# Patient Record
Sex: Female | Born: 1965 | Race: Black or African American | Hispanic: No | Marital: Married | Smoking: Never smoker
Health system: Southern US, Community
[De-identification: ages and names within clinical notes are randomized; demographics above are authoritative.]

## PROBLEM LIST (undated history)

## (undated) DIAGNOSIS — D649 Anemia, unspecified: Secondary | ICD-10-CM

## (undated) DIAGNOSIS — Z8679 Personal history of other diseases of the circulatory system: Secondary | ICD-10-CM

## (undated) DIAGNOSIS — I341 Nonrheumatic mitral (valve) prolapse: Secondary | ICD-10-CM

## (undated) DIAGNOSIS — H33109 Unspecified retinoschisis, unspecified eye: Secondary | ICD-10-CM

## (undated) DIAGNOSIS — M349 Systemic sclerosis, unspecified: Secondary | ICD-10-CM

## (undated) DIAGNOSIS — E7800 Pure hypercholesterolemia, unspecified: Secondary | ICD-10-CM

## (undated) DIAGNOSIS — I10 Essential (primary) hypertension: Secondary | ICD-10-CM

## (undated) DIAGNOSIS — K449 Diaphragmatic hernia without obstruction or gangrene: Secondary | ICD-10-CM

## (undated) DIAGNOSIS — M62838 Other muscle spasm: Secondary | ICD-10-CM

## (undated) DIAGNOSIS — E038 Other specified hypothyroidism: Secondary | ICD-10-CM

## (undated) DIAGNOSIS — G4733 Obstructive sleep apnea (adult) (pediatric): Secondary | ICD-10-CM

## (undated) DIAGNOSIS — R011 Cardiac murmur, unspecified: Secondary | ICD-10-CM

## (undated) DIAGNOSIS — B019 Varicella without complication: Secondary | ICD-10-CM

## (undated) HISTORY — DX: Other specified hypothyroidism: E03.8

## (undated) HISTORY — DX: Other muscle spasm: M62.838

## (undated) HISTORY — DX: Cardiac murmur, unspecified: R01.1

## (undated) HISTORY — DX: Nonrheumatic mitral (valve) prolapse: I34.1

## (undated) HISTORY — PX: BREAST SURGERY: SHX581

## (undated) HISTORY — DX: Personal history of other diseases of the circulatory system: Z86.79

## (undated) HISTORY — DX: Varicella without complication: B01.9

## (undated) HISTORY — DX: Anemia, unspecified: D64.9

## (undated) HISTORY — DX: Systemic sclerosis, unspecified: M34.9

## (undated) HISTORY — DX: Obstructive sleep apnea (adult) (pediatric): G47.33

## (undated) HISTORY — DX: Essential (primary) hypertension: I10

## (undated) HISTORY — DX: Pure hypercholesterolemia, unspecified: E78.00

## (undated) HISTORY — DX: Unspecified retinoschisis, unspecified eye: H33.109

## (undated) HISTORY — DX: Diaphragmatic hernia without obstruction or gangrene: K44.9

---

## 1997-01-31 HISTORY — PX: HERNIA REPAIR: SHX51

## 1997-07-16 ENCOUNTER — Ambulatory Visit (HOSPITAL_COMMUNITY): Admission: RE | Admit: 1997-07-16 | Discharge: 1997-07-16 | Payer: Self-pay | Admitting: Obstetrics and Gynecology

## 1997-09-16 ENCOUNTER — Ambulatory Visit (HOSPITAL_COMMUNITY): Admission: RE | Admit: 1997-09-16 | Discharge: 1997-09-16 | Payer: Self-pay | Admitting: Obstetrics and Gynecology

## 1997-10-13 ENCOUNTER — Inpatient Hospital Stay (HOSPITAL_COMMUNITY): Admission: AD | Admit: 1997-10-13 | Discharge: 1997-10-13 | Payer: Self-pay | Admitting: Obstetrics and Gynecology

## 1997-10-16 ENCOUNTER — Ambulatory Visit (HOSPITAL_COMMUNITY): Admission: RE | Admit: 1997-10-16 | Discharge: 1997-10-16 | Payer: Self-pay | Admitting: Obstetrics and Gynecology

## 1997-11-02 ENCOUNTER — Inpatient Hospital Stay (HOSPITAL_COMMUNITY): Admission: AD | Admit: 1997-11-02 | Discharge: 1997-11-02 | Payer: Self-pay | Admitting: Obstetrics and Gynecology

## 1997-11-28 ENCOUNTER — Inpatient Hospital Stay (HOSPITAL_COMMUNITY): Admission: AD | Admit: 1997-11-28 | Discharge: 1997-11-28 | Payer: Self-pay | Admitting: Obstetrics and Gynecology

## 1997-11-29 ENCOUNTER — Inpatient Hospital Stay (HOSPITAL_COMMUNITY): Admission: AD | Admit: 1997-11-29 | Discharge: 1997-11-29 | Payer: Self-pay | Admitting: Obstetrics and Gynecology

## 1997-12-03 ENCOUNTER — Inpatient Hospital Stay (HOSPITAL_COMMUNITY): Admission: AD | Admit: 1997-12-03 | Discharge: 1997-12-05 | Payer: Self-pay | Admitting: Obstetrics and Gynecology

## 1997-12-05 ENCOUNTER — Encounter (HOSPITAL_COMMUNITY): Admission: RE | Admit: 1997-12-05 | Discharge: 1998-01-09 | Payer: Self-pay | Admitting: Obstetrics and Gynecology

## 1998-01-19 ENCOUNTER — Ambulatory Visit (HOSPITAL_BASED_OUTPATIENT_CLINIC_OR_DEPARTMENT_OTHER): Admission: RE | Admit: 1998-01-19 | Discharge: 1998-01-19 | Payer: Self-pay | Admitting: General Surgery

## 1999-03-17 ENCOUNTER — Encounter: Payer: Self-pay | Admitting: Family Medicine

## 1999-03-17 ENCOUNTER — Ambulatory Visit (HOSPITAL_COMMUNITY): Admission: RE | Admit: 1999-03-17 | Discharge: 1999-03-17 | Payer: Self-pay | Admitting: Family Medicine

## 2000-03-17 ENCOUNTER — Ambulatory Visit (HOSPITAL_COMMUNITY): Admission: RE | Admit: 2000-03-17 | Discharge: 2000-03-17 | Payer: Self-pay | Admitting: Family Medicine

## 2000-03-17 ENCOUNTER — Encounter: Payer: Self-pay | Admitting: Family Medicine

## 2000-03-20 ENCOUNTER — Ambulatory Visit (HOSPITAL_COMMUNITY): Admission: RE | Admit: 2000-03-20 | Discharge: 2000-03-20 | Payer: Self-pay | Admitting: Neurology

## 2000-03-20 ENCOUNTER — Encounter: Payer: Self-pay | Admitting: Neurology

## 2000-04-10 ENCOUNTER — Emergency Department (HOSPITAL_COMMUNITY): Admission: EM | Admit: 2000-04-10 | Discharge: 2000-04-10 | Payer: Self-pay | Admitting: Emergency Medicine

## 2000-05-24 ENCOUNTER — Ambulatory Visit (HOSPITAL_COMMUNITY): Admission: RE | Admit: 2000-05-24 | Discharge: 2000-05-24 | Payer: Self-pay | Admitting: Family Medicine

## 2000-05-24 ENCOUNTER — Encounter: Payer: Self-pay | Admitting: Family Medicine

## 2000-12-29 ENCOUNTER — Encounter: Payer: Self-pay | Admitting: Family Medicine

## 2000-12-29 ENCOUNTER — Encounter: Admission: RE | Admit: 2000-12-29 | Discharge: 2000-12-29 | Payer: Self-pay | Admitting: Family Medicine

## 2001-01-10 ENCOUNTER — Encounter: Admission: RE | Admit: 2001-01-10 | Discharge: 2001-01-10 | Payer: Self-pay | Admitting: Family Medicine

## 2001-01-10 ENCOUNTER — Encounter: Payer: Self-pay | Admitting: Family Medicine

## 2001-06-28 ENCOUNTER — Other Ambulatory Visit: Admission: RE | Admit: 2001-06-28 | Discharge: 2001-06-28 | Payer: Self-pay | Admitting: Obstetrics and Gynecology

## 2001-12-17 ENCOUNTER — Ambulatory Visit (HOSPITAL_COMMUNITY): Admission: RE | Admit: 2001-12-17 | Discharge: 2001-12-17 | Payer: Self-pay | Admitting: *Deleted

## 2001-12-17 ENCOUNTER — Encounter: Payer: Self-pay | Admitting: *Deleted

## 2002-10-30 ENCOUNTER — Ambulatory Visit (HOSPITAL_COMMUNITY): Admission: RE | Admit: 2002-10-30 | Discharge: 2002-10-30 | Payer: Self-pay | Admitting: Obstetrics and Gynecology

## 2002-10-30 ENCOUNTER — Encounter: Payer: Self-pay | Admitting: Obstetrics and Gynecology

## 2005-07-05 ENCOUNTER — Ambulatory Visit (HOSPITAL_COMMUNITY): Admission: RE | Admit: 2005-07-05 | Discharge: 2005-07-05 | Payer: Self-pay | Admitting: Obstetrics and Gynecology

## 2007-02-01 HISTORY — PX: SPINE SURGERY: SHX786

## 2007-08-27 ENCOUNTER — Emergency Department (HOSPITAL_COMMUNITY): Admission: EM | Admit: 2007-08-27 | Discharge: 2007-08-27 | Payer: Self-pay | Admitting: Emergency Medicine

## 2007-09-02 ENCOUNTER — Encounter: Payer: Self-pay | Admitting: Family Medicine

## 2007-09-02 ENCOUNTER — Encounter: Admission: RE | Admit: 2007-09-02 | Discharge: 2007-09-02 | Payer: Self-pay | Admitting: Family Medicine

## 2007-09-17 ENCOUNTER — Encounter: Admission: RE | Admit: 2007-09-17 | Discharge: 2007-10-05 | Payer: Self-pay | Admitting: Family Medicine

## 2007-11-22 ENCOUNTER — Encounter: Admission: RE | Admit: 2007-11-22 | Discharge: 2007-11-22 | Payer: Self-pay | Admitting: Neurological Surgery

## 2008-01-10 ENCOUNTER — Inpatient Hospital Stay (HOSPITAL_COMMUNITY): Admission: RE | Admit: 2008-01-10 | Discharge: 2008-01-14 | Payer: Self-pay | Admitting: Neurological Surgery

## 2008-02-12 ENCOUNTER — Encounter: Admission: RE | Admit: 2008-02-12 | Discharge: 2008-02-12 | Payer: Self-pay | Admitting: Neurological Surgery

## 2008-04-14 ENCOUNTER — Encounter: Admission: RE | Admit: 2008-04-14 | Discharge: 2008-04-14 | Payer: Self-pay | Admitting: Neurological Surgery

## 2008-08-18 ENCOUNTER — Encounter: Admission: RE | Admit: 2008-08-18 | Discharge: 2008-08-18 | Payer: Self-pay | Admitting: Neurological Surgery

## 2008-09-01 ENCOUNTER — Encounter: Admission: RE | Admit: 2008-09-01 | Discharge: 2008-09-01 | Payer: Self-pay | Admitting: Dermatology

## 2010-06-15 NOTE — Op Note (Signed)
NAME:  Alicia Huff, Alicia Huff NO.:  [id number]   MEDICAL RECORD NO.:  [id number]          PATIENT TYPE:  INP   LOCATION:  3023                         FACILITY:  [facility]   PHYSICIAN:  Tia Alert, MD     DATE OF BIRTH:  08-08-1965   DATE OF PROCEDURE:  01/10/2008  DATE OF DISCHARGE:                               OPERATIVE REPORT   PREOPERATIVE DIAGNOSES:  1. Degenerative disk disease at L4-5.  2. Herniated nucleus pulposus at L4-5.  3. Back pain.   POSTOPERATIVE DIAGNOSES:  1. Degenerative disk disease at L4-5.  2. Herniated nucleus pulposus at L4-5.  3. Back pain.   PROCEDURE:  Anterior retroperitoneal lumbar interbody fusion at L4-5  with lateral plating with an XLT plate.   SURGEON:  Tia Alert, MD   ASSISTANT:  Reinaldo Meeker, MD   ANESTHESIA:  General endotracheal.   COMPLICATIONS:  None apparent.   INDICATIONS FOR PROCEDURE:  Ms. Scannell is a very pleasant 45-  year-old female who was referred with severe back pain with some aching  in her leg.  She had an MRI and then CT myelogram which showed large  annular tear at L4-5 with a midline disk herniation with disk  desiccation at that level.  She had mostly diskogenic back pain.  Therefore, I recommended a lumbar interbody fusion through anterior  retroperitoneal approach.  She understood the risks, benefits, and  expected outcome and wished to proceed.   DESCRIPTION OF PROCEDURE:  The patient was taken to the operating room  and after induction of adequate generalized endotracheal anesthesia, she  was placed in the lateral position with a right side down and left side  exposed.  She was positioned for the typical anterior retroperitoneal  approach.  We marked our incisions utilizing lateral fluoroscopy and  then made a lateral incision and a more posterior incision.  We then  used blunt dissection with our fingers to dissect into the  retroperitoneal space through the more posterior  incision until we could  palpate the transverse processes and the psoas musculature and the iliac  crest, then dissected from the posterior incision to the anterior  incision utilizing her fingers and then guided our first dilator to the  psoas musculature.  This was attached to EMG monitoring.  We docked in  the mid portion of the disk space and then used sequential dilation  utilizing the EMG monitoring the entire time on each dilator turning it  anterior and posterior to make sure that we were not close to the nerves  within the psoas musculature.  Once we got a final retractor in place,  we expanded it opened two clicks both anterior-posterior and cranial  caudal and then removed the dilators leaving the K-wire in place and  then tested with the nerve monitoring to ensure there were no nerves  within our field.  We then placed the shim into the disk space at L4-5  utilized going back and forth between AP and lateral fluoroscopy of the  entire case.  The annulus was then incised.  The diskectomy was dome  with a pituitary rongeurs and then, we passed the  Cobb dissector along  the endplates and through the opposite lateral annulus.  We made sure we  had an anterior posterior trajectory slightly because of the vessels on  the opposite side.  We opened the opposite annulus and then we used  sequential distraction to distract the disk space of 10 mm.  We used our  first template as a 10 x 50 mm template.  We then continued to clean the  disk space with different rasps and scrapers until the endplates were  prepared.  We then used a 10 x 50 mm PEEK interbody cage and packed this  with Actifuse and Osteocel Plus, and then tapped this into position at  L4-5 again obtaining that anterior to posterior trajectory just slightly  until it was in position from apophysis to apophysis at L4-5 and we did  this under AP fluoroscopy.  We then used a lateral XLT plate.  We used  the guide with the  template to hand drill both at L4 and L5 and then  placed 45 mm bolts into the vertebral bodies of L4 and L5.  We then  placed the lateral plate and locked this into position with a locking  cap through the anti-torque device.  We then got a final AP and lateral  fluoroscopic shots to evaluate our construct, irrigated with saline  solution containing bacitracin, removed our retractor, dried all  bleeding points, and then closed the fascia with 2-0 Vicryl, closed the  subcuticular tissue with 3-0 Vicryl, and closed the skin with Benzoin  and Steri-Strips.  The drapes were removed.  Sterile dressing was  applied.  The patient was awakened from general anesthesia and  transferred to recovery room in stable condition.  At the end of the  procedure, all sponge, needle, and instrument counts were correct.      Tia Alert, MD  Electronically Signed     DSJ/MEDQ  D:  01/10/2008  T:  01/11/2008  Job:  [id number]

## 2010-06-15 NOTE — Discharge Summary (Signed)
NAME:  Alicia Huff, Alicia Huff    ACCOUNT NO.:  [id number]   MEDICAL RECORD NO.:  [id number]          PATIENT TYPE:  INP   LOCATION:  3023                         FACILITY:  [facility]   PHYSICIAN:  Tia Alert, MD     DATE OF BIRTH:  1965-08-22   DATE OF ADMISSION:  01/10/2008  DATE OF DISCHARGE:  01/14/2008                               DISCHARGE SUMMARY   ADMITTING DIAGNOSES:  Lumbar degenerative disk disease with annular tear  and disk rupture at L4-5.   PROCEDURE:  Anterior retroperitoneal interbody fusion at L4-5.   BRIEF HISTORY OF PRESENT ILLNESS:  [name] is a 45 year old female who  presented with severe back pain with some leg pain.  She had an MRI and  a CT myelogram, which showed degenerative disk disease at L4-5 with a  large annular tear and a midline disk herniations.  She tried medical  management for quite some time without significant relief.  I  recommended anterior retroperitoneal approach to the interbody fusion.  She understood the risks, benefits, expected outcome, and wished to  proceed.   HOSPITAL COURSE:  The patient was admitted on January 10, 2008, and  taken to the operating room where she underwent an anterior  retroperitoneal interbody fusion L4-5 with lateral plating.  She  tolerated the procedure well and was taken to the recovery room in full  stable condition.  For details of the operative procedure, please see  the dictated operative note.  Her hospital course was fairly routine.  She did have some pain and numbness in the anterior quadriceps from the  psoas muscle dissection from the surgery.  Her back pain was improved,  as compared to preop.  She worked with physical and occupational therapy  and made some stride.  She remained afebrile with stable vital signs.  Her incisions remained clean, dry, and intact.  She was tolerating a  regular diet.  She was discharged home in stable condition on January 14, 2008, with plans to follow up in 2  weeks.   FINAL DIAGNOSIS:  Anterior retroperitoneal interbody fusion.      Tia Alert, MD  Electronically Signed     DSJ/MEDQ  D:  01/14/2008  T:  01/14/2008  Job:  [id number]

## 2010-09-28 IMAGING — CR DG CHEST 2V
2 series · 2 of 2 positions shown · non-contrast
Comparison: None

CLINICAL DATA: Preadmission for OR.

CHEST - 2 VIEW

[view not recorded (1 of 2)]
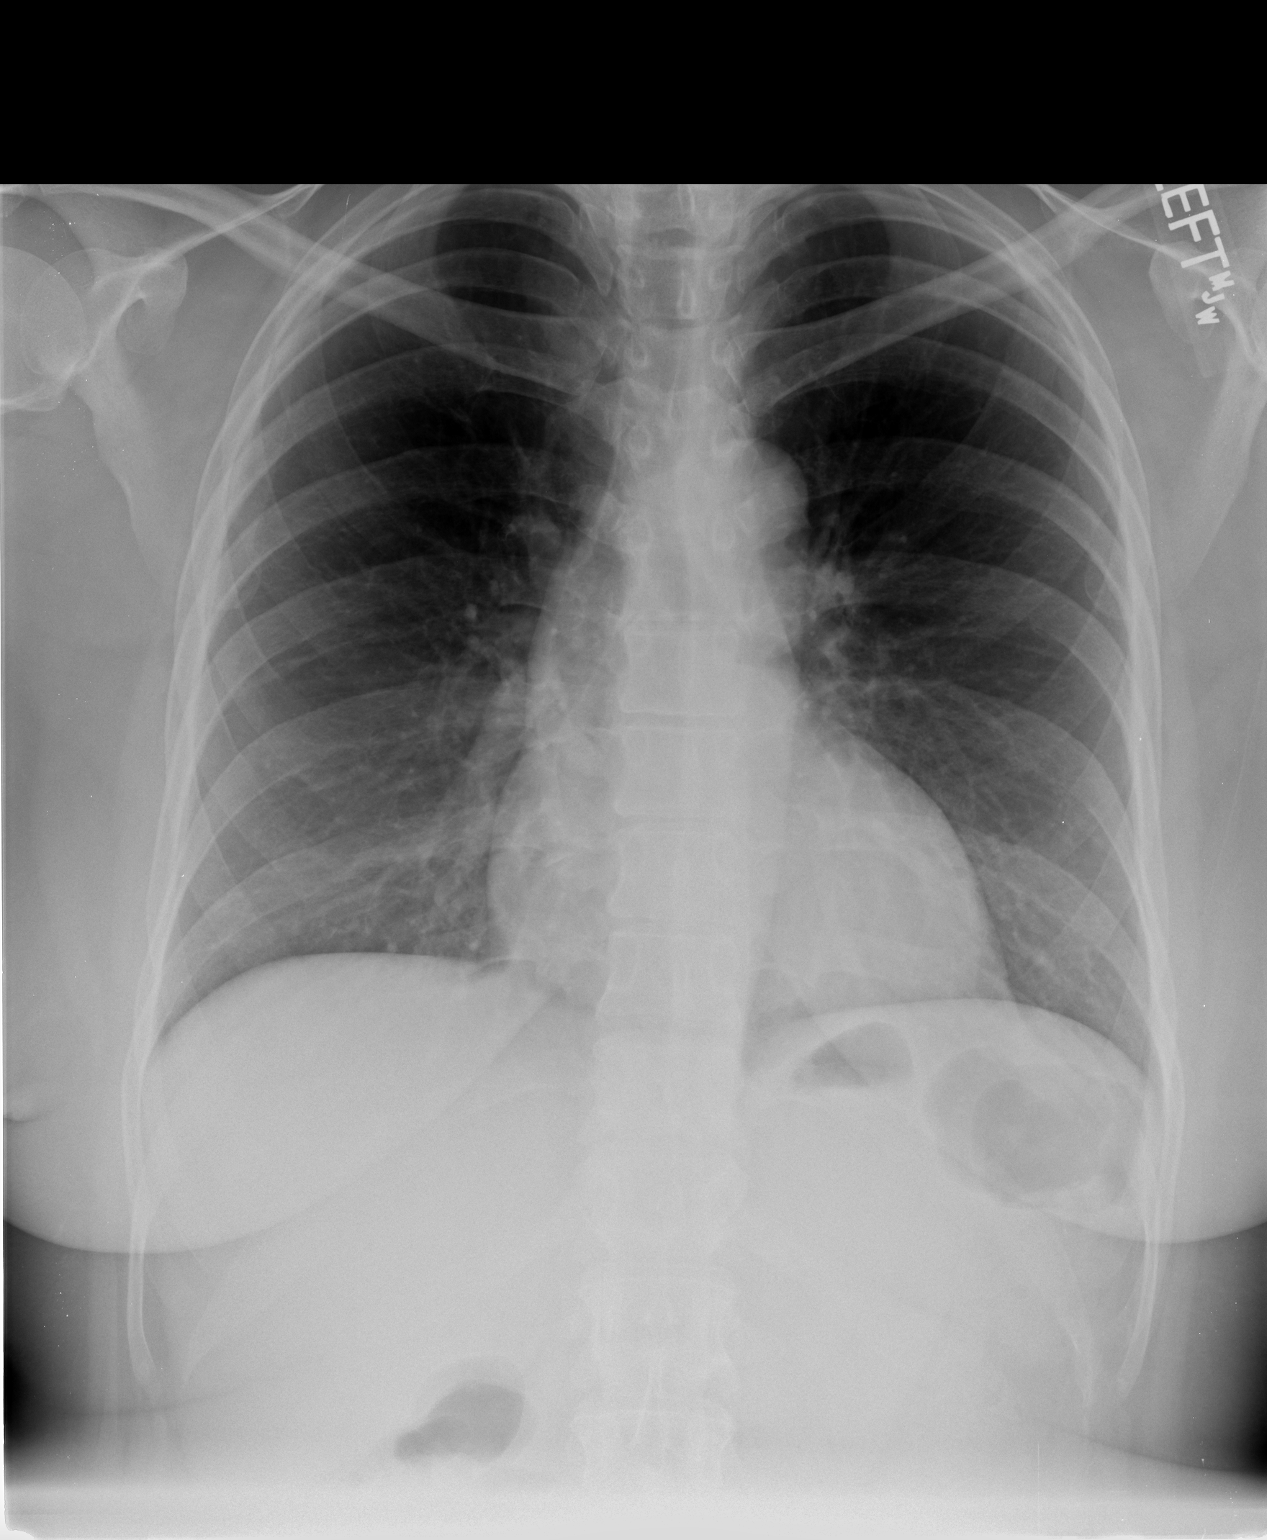

[view not recorded (2 of 2)]
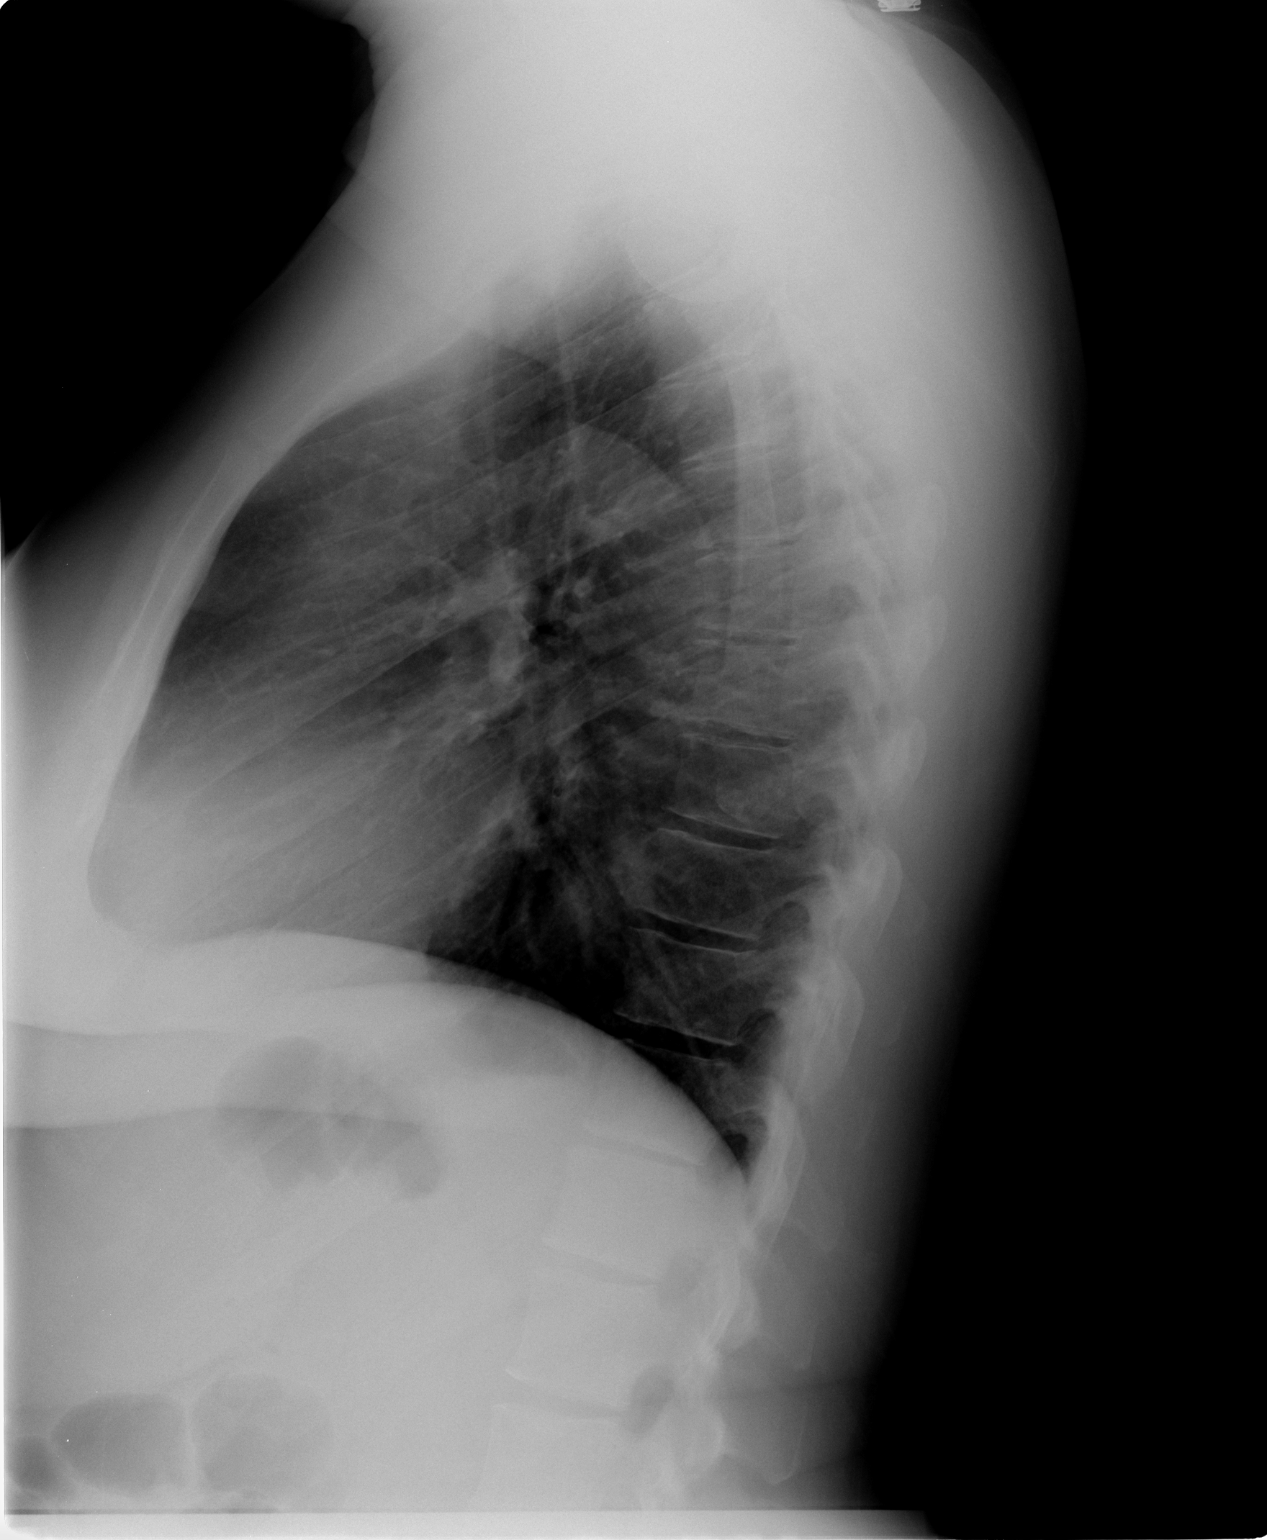

[2 of 2 positions shown; findings below may reference images not displayed]

FINDINGS: Trachea is midline.  Heart size within normal limits.
Lungs are clear.  No pleural fluid.
IMPRESSION: No acute findings.

## 2010-11-04 LAB — TYPE AND SCREEN
ABO/RH(D): B POS
Antibody Screen: POSITIVE
Antibody Screen: POSITIVE

## 2010-11-04 LAB — PROTIME-INR: INR: 1 (ref 0.00–1.49)

## 2010-11-04 LAB — BASIC METABOLIC PANEL
Calcium: 9.6 mg/dL (ref 8.4–10.5)
GFR calc Af Amer: >60 mL/min (ref >60)
GFR calc non Af Amer: >60 mL/min (ref >60)
Sodium: 136 mEq/L (ref 135–145)

## 2010-11-04 LAB — CBC
Hemoglobin: 9.9 g/dL — ABNORMAL LOW (ref 12.0–15.0)
RBC: 5.34 MIL/uL — ABNORMAL HIGH (ref 3.87–5.11)

## 2010-11-04 LAB — APTT: aPTT: 30 seconds (ref 24–37)

## 2010-11-04 LAB — DIFFERENTIAL
Basophils Relative: 0 % (ref 0–1)
Eosinophils Relative: 1 % (ref 0–5)
Lymphocytes Relative: 24 % (ref 12–46)
Neutrophils Relative %: 64 % (ref 43–77)

## 2011-05-08 IMAGING — CR DG LUMBAR SPINE 2-3V
3 series · 3 of 3 positions shown · non-contrast
Comparison: 04/14/2008

CLINICAL DATA: Low back pain/surgery January 2008

LUMBAR SPINE - 2-3 VIEW

[t l-spine a.p.]
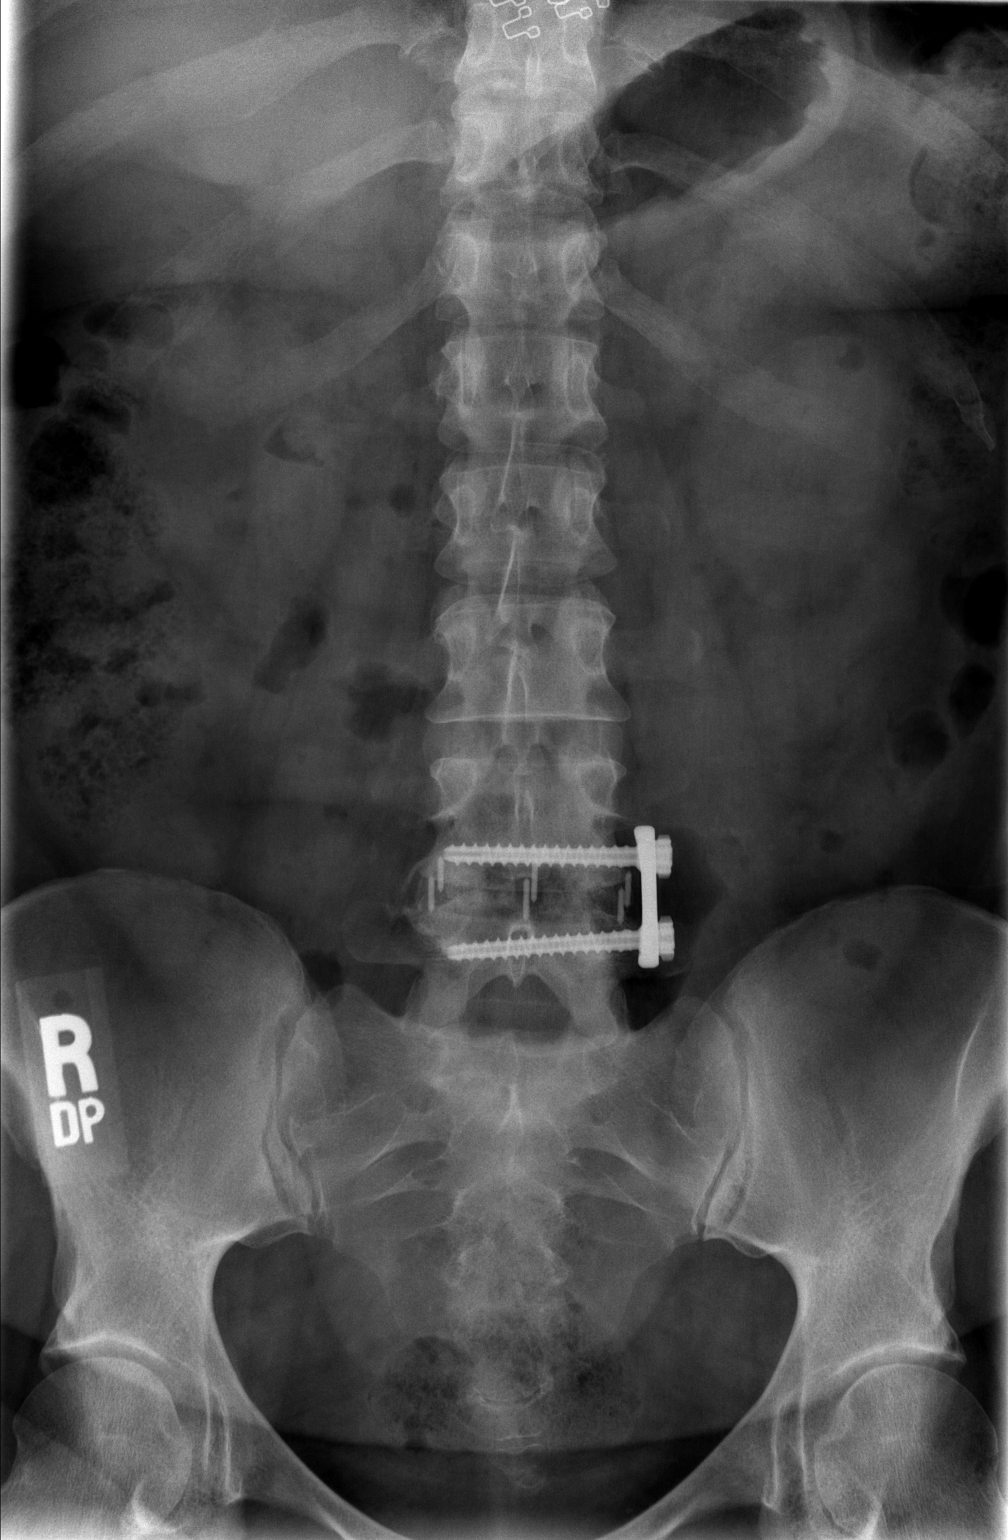

[t l-spine lat]
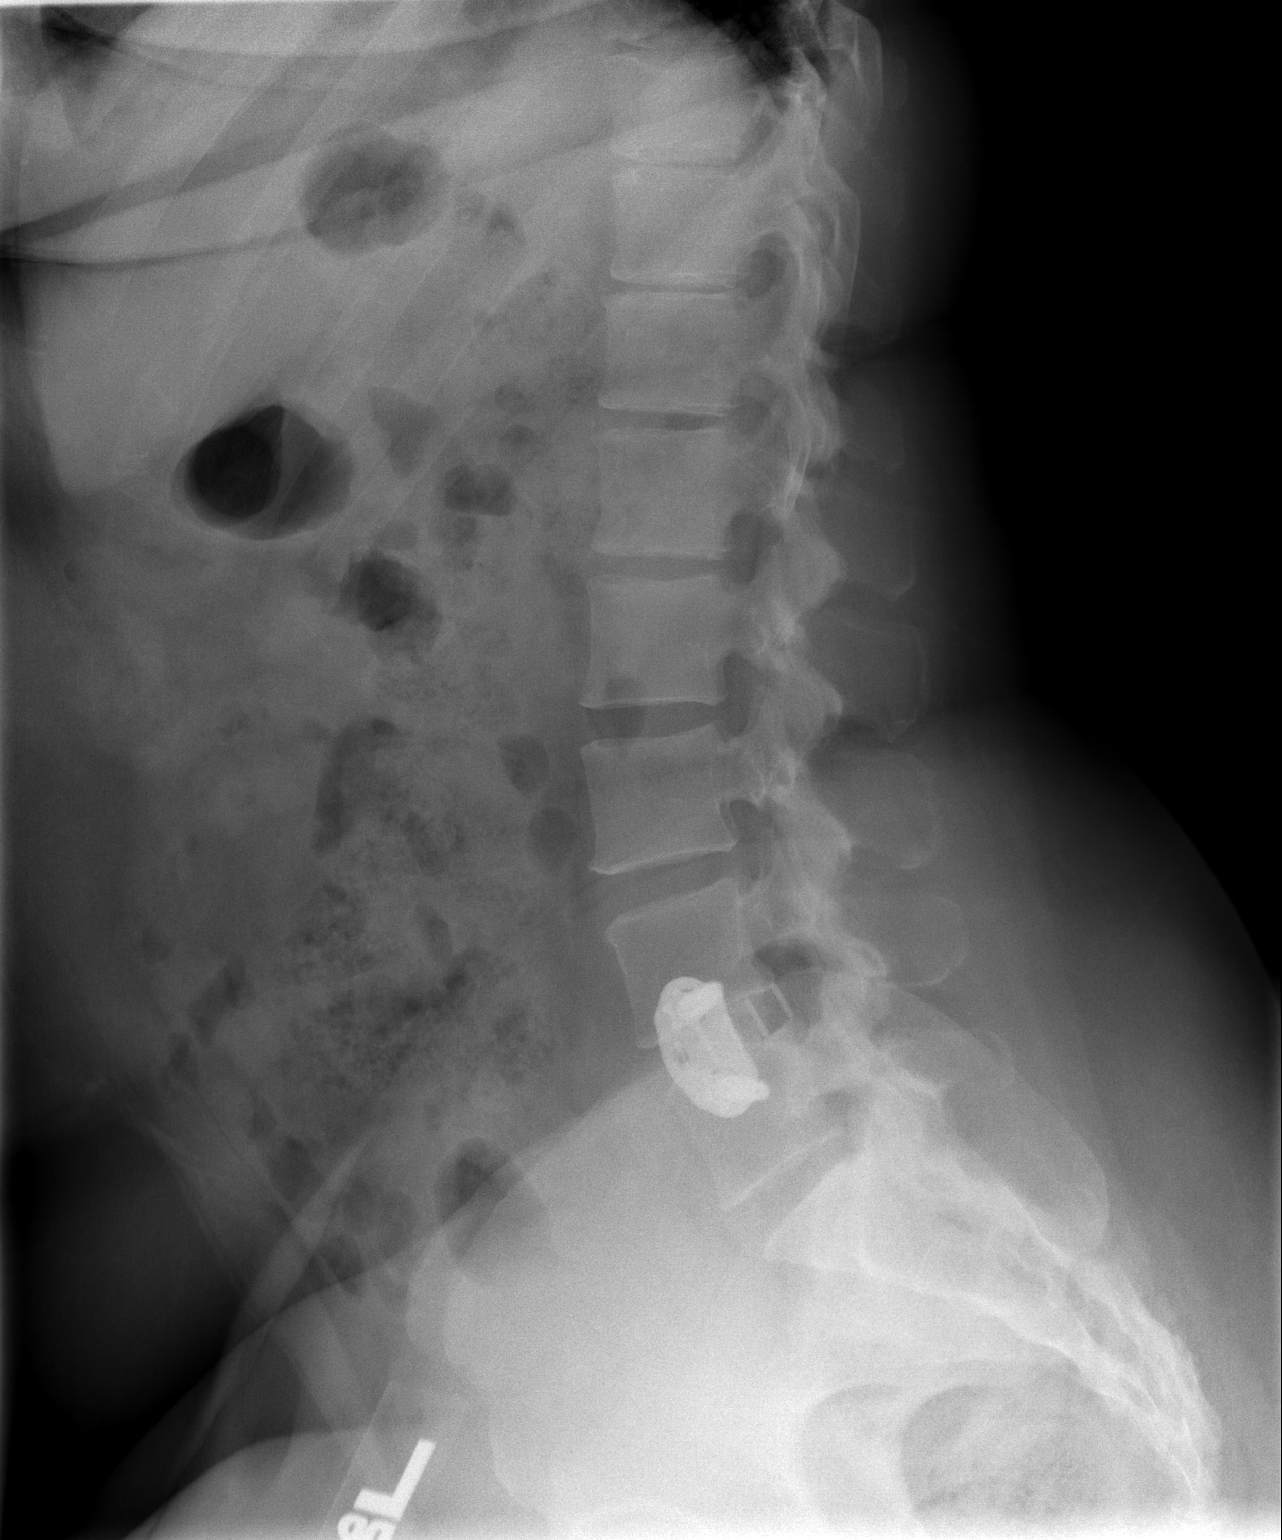

[t l-spine l5-s1 spot]
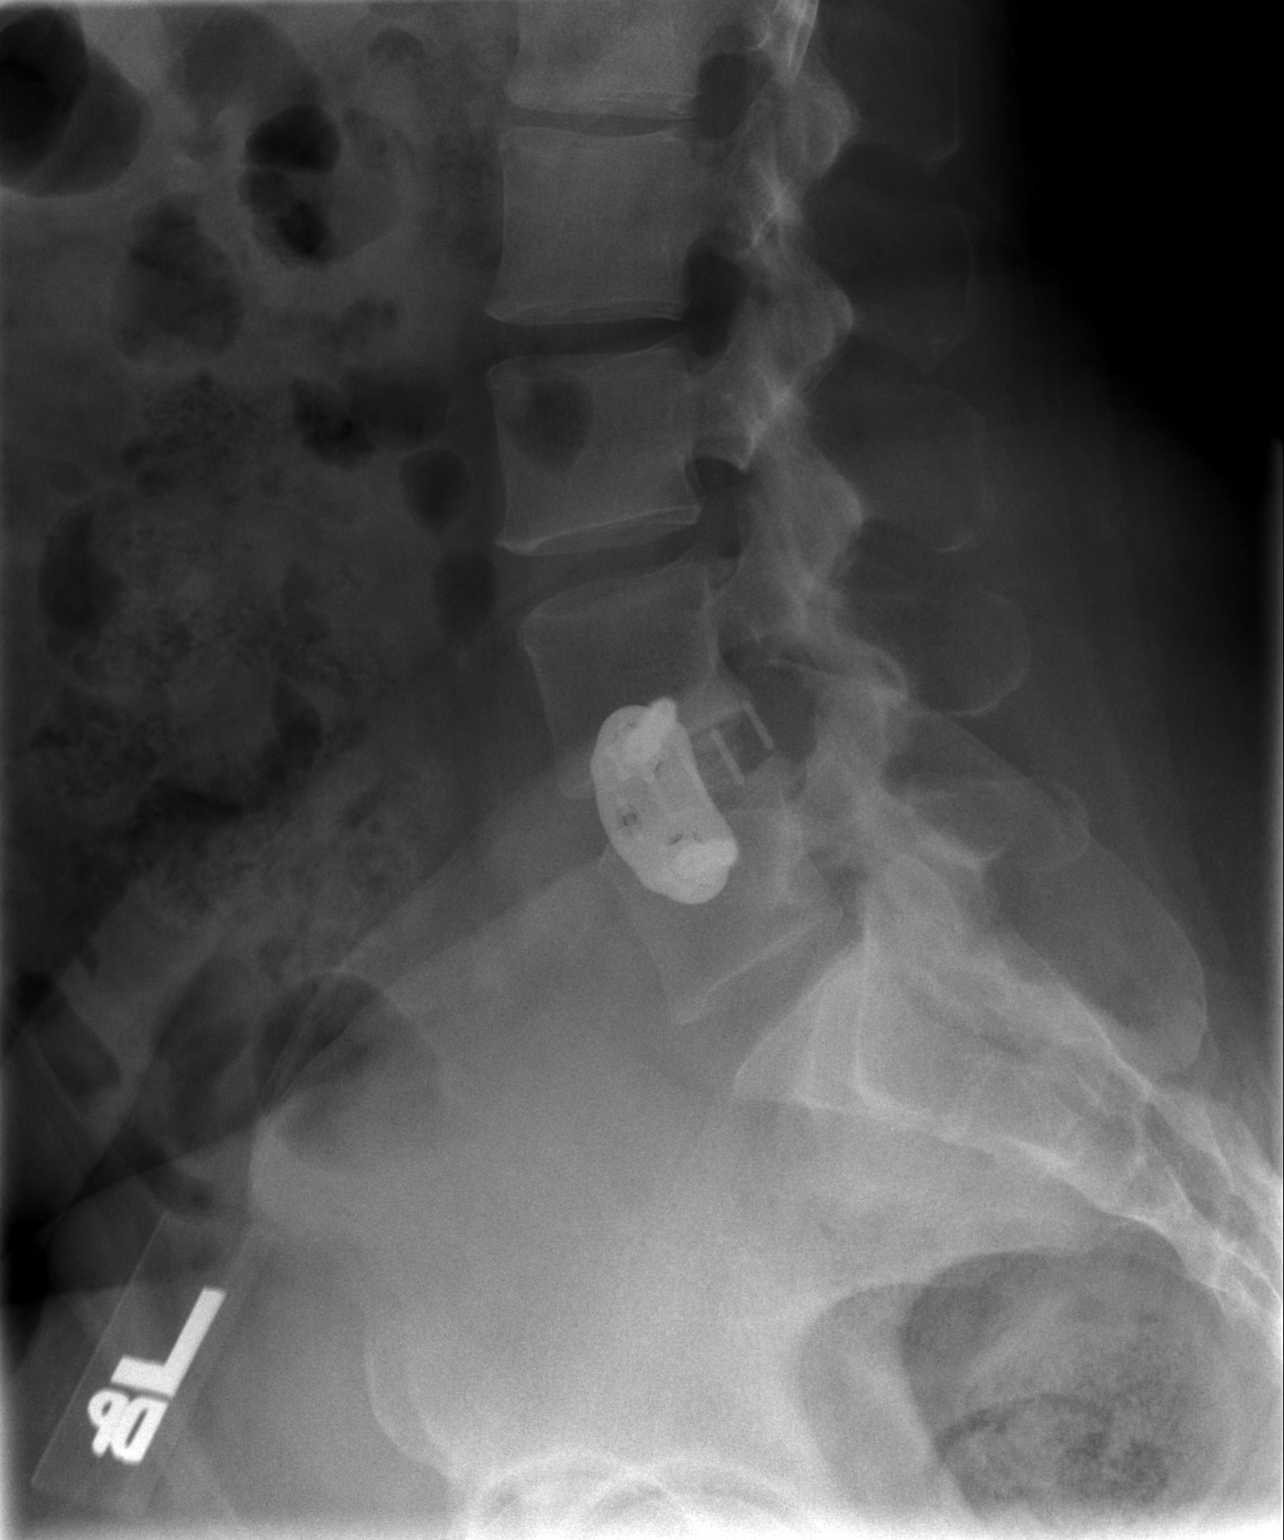

[3 of 3 positions shown; findings below may reference images not displayed]

FINDINGS: Status post L4-5 discectomy with left lateral screw plate
fusion.  Good position and alignment of bony elements hardware.
Other disc height preserved.  No change.
IMPRESSION: Good position alignment following L4-L5 discectomy with left
lateral fusion plate - no significant change.

## 2011-05-11 ENCOUNTER — Ambulatory Visit (INDEPENDENT_AMBULATORY_CARE_PROVIDER_SITE_OTHER): Payer: BC Managed Care – PPO | Admitting: Family Medicine

## 2011-05-11 ENCOUNTER — Encounter: Payer: Self-pay | Admitting: Family Medicine

## 2011-05-11 VITALS — BP 160/92 | HR 78 | Temp 98.5°F | Ht 67.0 in | Wt 223.3 lb

## 2011-05-11 DIAGNOSIS — I10 Essential (primary) hypertension: Secondary | ICD-10-CM

## 2011-05-11 DIAGNOSIS — H33109 Unspecified retinoschisis, unspecified eye: Secondary | ICD-10-CM

## 2011-05-11 DIAGNOSIS — Z981 Arthrodesis status: Secondary | ICD-10-CM

## 2011-05-11 DIAGNOSIS — G8929 Other chronic pain: Secondary | ICD-10-CM

## 2011-05-11 DIAGNOSIS — N912 Amenorrhea, unspecified: Secondary | ICD-10-CM | POA: Insufficient documentation

## 2011-05-11 DIAGNOSIS — M545 Low back pain: Secondary | ICD-10-CM

## 2011-05-11 MED ORDER — HYDROCODONE-ACETAMINOPHEN 5-500 MG PO TABS: 1.0000 | ORAL_TABLET | Freq: Three times a day (TID) | ORAL | Status: DC | PRN

## 2011-05-11 MED ORDER — LOSARTAN POTASSIUM 50 MG PO TABS: 50.0000 mg | ORAL_TABLET | Freq: Every day | ORAL | Status: DC

## 2011-05-11 NOTE — Patient Instructions (Signed)
We'll call you with your eye exam, neurosurg appt Start the vicodin as needed for pain Increase the dose of Cozaar to 50mg - 2 of what you have at home and 1 of the new script Continue the hydrochlorothiazide daily We'll get this right!  Hang in there!

## 2011-05-11 NOTE — Assessment & Plan Note (Signed)
New to provider.  Pt needs referral for complete evaluation.

## 2011-05-11 NOTE — Progress Notes (Signed)
  Subjective:    Patient ID: Alicia Huff, female    DOB: [birth year], 46 y.o.   MRN: [id number]  HPI New to establish.  [facility] 2012.  HTN- pt on Cozaar, HCTZ.  Previously on Lisinopril but this caused cough.  Has been homeless and this caused obvious stress.  Reports she get very nervous about MD visits.  Admits to intermittent CP, associated SOB.  Pain is described as sharp and fleeting.  Pain worsens w/ deep breaths.  Back pain- 2009 had spinal fusion.  'never recovered'.  Lost job from missing work.  Still having considerable amount of pain- unable to sit or stand for any amount of time.  Reports 'L leg has never been right' since the surgery.  Anemia- hx of heavy menstrual periods.  LMP 1/5-2/7.  Has not had period since.  Having nausea.  Has 4 children.  Has not taken Upreg.  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  Was supposed to have f/u but then lost insurance.  Pt still w/ visual disturbance in R eye.   Review of Systems For ROS see HPI     Objective:   Physical Exam  Vitals reviewed. Constitutional: She is oriented to person, place, and time. She appears well-developed and well-nourished. She appears distressed (tearful, obviously uncomfortable, unable to sit still due to pain).  HENT:  Head: Normocephalic and atraumatic.  Eyes: Conjunctivae and EOM are normal. Pupils are equal, round, and reactive to light.  Neck: Normal range of motion. Neck supple. No thyromegaly present.  Cardiovascular: Normal rate, regular rhythm, normal heart sounds and intact distal pulses.   No murmur heard. Pulmonary/Chest: Effort normal and breath sounds normal. No respiratory distress.  Abdominal: Soft. She exhibits no distension. There is no tenderness.  Musculoskeletal: She exhibits no edema.  Lymphadenopathy:    She has no cervical adenopathy.  Neurological: She is alert and  oriented to person, place, and time.  Skin: Skin is warm and dry.  Psychiatric: Her behavior is normal.       Tearful, obviously upset          Assessment & Plan:

## 2011-05-11 NOTE — Assessment & Plan Note (Signed)
New to provider, chronic for pt.  Had bad experience w/ lumbar fusion.  Reports hardware is shifting and at times palpable.  Start vicodin for pain relief.  Refer to neurosurg.  Husband has seen Dr Wynetta Emery- pt very interested in seeing him.  Will refer.

## 2011-05-11 NOTE — Assessment & Plan Note (Signed)
New to provider, chronic for pt.  Increase Cozaar from 25mg  to 50mg .  Continue HCTZ (dose currently unknown).  Pt is currently asymptomatic.  Will follow closely.  Wanted to get labs but pt had to leave to pick up son.

## 2011-05-11 NOTE — Assessment & Plan Note (Signed)
New.  Perimenopausal vs pregnancy . Upreg negative.  Pt relieved.

## 2011-05-11 NOTE — Assessment & Plan Note (Signed)
See above

## 2011-06-10 ENCOUNTER — Other Ambulatory Visit: Payer: Self-pay | Admitting: *Deleted

## 2011-06-10 MED ORDER — HYDROCODONE-ACETAMINOPHEN 5-500 MG PO TABS: 1.0000 | ORAL_TABLET | Freq: Three times a day (TID) | ORAL | Status: AC | PRN

## 2011-06-10 NOTE — Telephone Encounter (Signed)
Addended by: Derry Lory A on: 06/10/2011 12:06 PM   Modules accepted: Orders

## 2011-06-10 NOTE — Telephone Encounter (Signed)
.  rx faxed to pharmacy, manually.  

## 2011-06-10 NOTE — Telephone Encounter (Signed)
Ok for #30, 1 refill 

## 2011-06-10 NOTE — Telephone Encounter (Signed)
Please note prior request

## 2011-06-10 NOTE — Telephone Encounter (Signed)
Pt left vm requesting a refill for her Vicodin to be sent to [facility] on [address], noted last OV and refill 05-11-11 #30 with no refills

## 2011-07-13 ENCOUNTER — Telehealth: Payer: Self-pay | Admitting: Family Medicine

## 2011-07-13 NOTE — Telephone Encounter (Signed)
Patient called and needs an update on her referral status please call as soon as you can  Thanks  Patient call back# [phone]

## 2011-07-13 NOTE — Telephone Encounter (Signed)
Please review pt referral requests for Neurosurgery and Opthalmology per noted still waiting from [facility]

## 2011-07-14 ENCOUNTER — Telehealth: Payer: Self-pay | Admitting: *Deleted

## 2011-07-14 NOTE — Telephone Encounter (Signed)
I spoke with patient yesterday and went over the referral notes with her & patient stated she was in over a week ago to sign medical records release. Patient is urgently waiting to see the Neurologist. Can this referral please be worked today

## 2011-07-14 NOTE — Telephone Encounter (Signed)
Called pt to advise apologies about the wait to get her approved for an apt with the Neurosurgery, this nurse advised that our referral coordinator has not been available all week however we do have another referral rep from another office currently working on her apt and that I will refax her medical release form to [place] again today, pt did note that she has already had her eye apt on may 23rd-2013 and will await our call concerning the apt. Pt understood.

## 2011-07-14 NOTE — Telephone Encounter (Signed)
See phone note dated for today 07-14-11 per spoke to pt to advise that we are currently working on the request and apologized for inconvenience, re-faxed medical release forms to [facility] today, pt understood and noted that she thought Luster Landsberg mentioned another Midwife in [place]

## 2011-07-19 NOTE — Telephone Encounter (Signed)
Spoke to referral rep whom advised office still has not sent records at this time, the office has been contacted via VM times two, referral rep will contact office and take over the process for the pt going forward

## 2011-07-19 NOTE — Telephone Encounter (Signed)
Spoke to referral rep who noted that the office concerning pt medical records has been contacted via Vm times 2 with no return call, referral rep noted she will contact the office again to complete the process for the pt asap.

## 2012-05-03 ENCOUNTER — Telehealth: Payer: Self-pay | Admitting: Family Medicine

## 2012-05-03 ENCOUNTER — Ambulatory Visit (INDEPENDENT_AMBULATORY_CARE_PROVIDER_SITE_OTHER): Payer: BC Managed Care – PPO | Admitting: Family Medicine

## 2012-05-03 ENCOUNTER — Encounter: Payer: Self-pay | Admitting: Family Medicine

## 2012-05-03 VITALS — BP 178/100 | HR 81 | Temp 98.4°F | Ht 67.75 in | Wt 233.2 lb

## 2012-05-03 DIAGNOSIS — I10 Essential (primary) hypertension: Secondary | ICD-10-CM

## 2012-05-03 LAB — CBC WITH DIFFERENTIAL/PLATELET
Basophils Absolute: 0 10*3/uL (ref 0.0–0.1)
Eosinophils Absolute: 0.1 10*3/uL (ref 0.0–0.7)
Eosinophils Relative: 1.3 % (ref 0.0–5.0)
MCHC: 31.6 g/dL (ref 30.0–36.0)
MCV: 78.9 fl (ref 78.0–100.0)
Monocytes Absolute: 0.5 10*3/uL (ref 0.1–1.0)
Neutrophils Relative %: 59.6 % (ref 43.0–77.0)
Platelets: 217 10*3/uL (ref 150.0–400.0)
RDW: 17.4 % — ABNORMAL HIGH (ref 11.5–14.6)
WBC: 6.5 10*3/uL (ref 4.5–10.5)

## 2012-05-03 LAB — LIPID PANEL
HDL: 58.2 mg/dL (ref >39.00)
Triglycerides: 64 mg/dL (ref 0.0–149.0)
VLDL: 12.8 mg/dL (ref 0.0–40.0)

## 2012-05-03 LAB — HEPATIC FUNCTION PANEL
AST: 21 U/L (ref 0–37)
Alkaline Phosphatase: 73 U/L (ref 39–117)
Bilirubin, Direct: 0.1 mg/dL (ref 0.0–0.3)
Total Bilirubin: 0.7 mg/dL (ref 0.3–1.2)

## 2012-05-03 LAB — BASIC METABOLIC PANEL
Calcium: 9.9 mg/dL (ref 8.4–10.5)
GFR: 102.54 mL/min (ref >60.00)
Potassium: 3 mEq/L — ABNORMAL LOW (ref 3.5–5.1)
Sodium: 136 mEq/L (ref 135–145)

## 2012-05-03 LAB — LDL CHOLESTEROL, DIRECT: Direct LDL: 178.3 mg/dL

## 2012-05-03 MED ORDER — OLMESARTAN MEDOXOMIL-HCTZ 40-12.5 MG PO TABS: 1.0000 | ORAL_TABLET | Freq: Every day | ORAL | Status: DC

## 2012-05-03 NOTE — Patient Instructions (Addendum)
Follow up in 1 month to recheck BP Start the Benicar HCT daily- every day!  Use the coupon card If you have chest pain, shortness of breath, severe headache, or other concerns- go to ER We'll notify you of your lab results Hang in there!!!

## 2012-05-03 NOTE — Progress Notes (Signed)
  Subjective:    Patient ID: Alicia Huff, female    DOB: [birth year], 47 y.o.   MRN: [id number]  HPI HTN- chronic problem, has been out of BP meds x6 months, never called.  BP was elevated at Neurosurg office and told to follow up.  No CP, SOB, HAs, visual changes, edema.  Pt refused labs at last visit.  No recent labs to assess risk factors.   Review of Systems For ROS see HPI     Objective:   Physical Exam  Constitutional: She is oriented to person, place, and time. She appears well-developed and well-nourished. No distress.  HENT:  Head: Normocephalic and atraumatic.  Eyes: Conjunctivae and EOM are normal. Pupils are equal, round, and reactive to light.  Neck: Normal range of motion. Neck supple. No thyromegaly present.  Cardiovascular: Normal rate, regular rhythm and intact distal pulses.   Murmur (II/VI SEM) heard. Pulmonary/Chest: Effort normal and breath sounds normal. No respiratory distress. She has no wheezes.  Abdominal: Soft. She exhibits no distension. There is no tenderness.  Musculoskeletal: She exhibits no edema.  Lymphadenopathy:    She has no cervical adenopathy.  Neurological: She is alert and oriented to person, place, and time.  Skin: Skin is warm and dry.  Psychiatric: She has a normal mood and affect. Her behavior is normal.          Assessment & Plan:

## 2012-05-03 NOTE — Telephone Encounter (Signed)
pointment Scheduled:  05/03/2012 13:30:00  Appointment Scheduled Provider:  Sheliah Hatch.

## 2012-05-03 NOTE — Telephone Encounter (Signed)
Patient Information:  Caller Name: Alicia Huff  Phone: [phone]  Patient: Alicia Huff, Alicia Huff  Gender: Female  DOB: 09-07-1965  Age: 47 Years  PCP: Sheliah Hatch  Pregnant: No  Office Follow Up:  Does the office need to follow up with this patient?: No  Instructions For The Office: N/A   Symptoms  Reason For Call & Symptoms: Patient reports she was seen by MD at Dr. Wells Guiles office for back problems on 05/03/12 and was told her BP is elevated; 177/132 and 180/113 before leaving. Relates she previously took Losartan without relief of BP.  Emergent symptoms ruled out.  See Today in Office per High Blood Pressure protocol.  Reviewed Health History In EMR: Yes  Reviewed Medications In EMR: Yes  Reviewed Allergies In EMR: Yes  Reviewed Surgeries / Procedures: Yes  Date of Onset of Symptoms: 05/03/2012 OB / GYN:  LMP: Unknown  Guideline(s) Used:  High Blood Pressure  Disposition Per Guideline:   See Today in Office  Reason For Disposition Reached:   BP > 180/110  Advice Given:  General:  Untreated high blood pressure may cause damage to the heart, brain, kidneys, and eyes.  Treatment of high blood pressure can reduce the risk of stroke, heart attack, and heart failure.  The goal of blood pressure treatment for most patients with hypertension is to keep the blood pressure under 140/90.  Call Back If:  Headache, blurred vision, difficulty talking, or difficulty walking occurs  Chest pain or difficulty breathing occurs  You become worse.  Patient Will Follow Care Advice:  YES  Appointment Scheduled:  05/03/2012 13:30:00 Appointment Scheduled Provider:  Sheliah Hatch.

## 2012-05-06 NOTE — Assessment & Plan Note (Signed)
Deteriorated.  Reviewed the importance of BP control even w/out sxs.  Discussed possibility of MI or CVA due to untreated HTN.  Discussed low salt diet, regular exercise.  Will start ARB/HCTZ combo to attempt to get BP to goal.  Check labs as baseline and also to risk stratify.  Reviewed supportive care and red flags that should prompt return.  Pt expressed understanding and is in agreement w/ plan.

## 2012-05-10 ENCOUNTER — Telehealth: Payer: Self-pay | Admitting: *Deleted

## 2012-05-10 MED ORDER — LEVOTHYROXINE SODIUM 50 MCG PO TABS: 50.0000 ug | ORAL_TABLET | Freq: Every day | ORAL | Status: DC

## 2012-05-10 MED ORDER — ATORVASTATIN CALCIUM 20 MG PO TABS: 20.0000 mg | ORAL_TABLET | Freq: Every day | ORAL | Status: DC

## 2012-05-10 MED ORDER — POTASSIUM CHLORIDE CRYS ER 20 MEQ PO TBCR: 20.0000 meq | EXTENDED_RELEASE_TABLET | Freq: Every day | ORAL | Status: DC

## 2012-05-10 NOTE — Telephone Encounter (Signed)
Message copied by Verdie Shire on Thu May 10, 2012 10:18 AM ------      Message from: Sheliah Hatch      Created: Thu May 03, 2012  5:06 PM       Thyroid level is at upper limit of normal- this could be causing pt's fatigue.  Based on this, would start Synthroid daily      K+ is low- needs to start daily and increase her dietary intake in the form of leafy greens, citrus fruits, bananas      Total cholesterol and LDL are both elevated- based on this, needs to start Lipitor 20mg  nightly and recheck liver functions at next office visit.            These things are all very important and she needs to follow up on each of them.       ------

## 2012-05-10 NOTE — Telephone Encounter (Signed)
Spoke with the pt and informed her of recent lab results and note.   Pt understood and agreed to start new med.  Asked the pt if she is connected to MyChart and she stated that she was and I informed her that she can go on MyChart and see her results.  Pt agreed.   New rx's were sent to the pharmacy by e-script.//AB/CMA

## 2012-05-21 ENCOUNTER — Telehealth: Payer: Self-pay | Admitting: Family Medicine

## 2012-05-21 NOTE — Telephone Encounter (Signed)
Patient states she would like the cyst on her L wrist to be drained. She states she spoke w/Dr. Beverely Low about this at one of her recent visits.

## 2012-05-21 NOTE — Telephone Encounter (Signed)
Please advise no mention of cyst at last OV

## 2012-05-21 NOTE — Telephone Encounter (Signed)
There was no mention of cyst at last visit---it can wait until Dr Beverely Low gets back or pt can make appointment to see Dr Beverely Low when she is back

## 2012-05-22 NOTE — Telephone Encounter (Signed)
Pt has appt next Friday 5/2 to f/u BP.  We can discuss her referral at that time

## 2012-05-22 NOTE — Telephone Encounter (Signed)
Pt states that she will await Dr Beverely Low return to see if she is willing to refer her without another OV. Marland KitchenPlease advise on referral

## 2012-05-22 NOTE — Telephone Encounter (Signed)
Left message to call office

## 2012-05-23 NOTE — Telephone Encounter (Signed)
Spoke with the pt and informed her of Dr. Beverely Low recommendation to discuss the cyst and refer her at her next appt which is 06/01/12.  Pt agreed.//ABCMA

## 2012-06-01 ENCOUNTER — Ambulatory Visit (INDEPENDENT_AMBULATORY_CARE_PROVIDER_SITE_OTHER): Payer: BC Managed Care – PPO | Admitting: Family Medicine

## 2012-06-01 ENCOUNTER — Encounter: Payer: Self-pay | Admitting: Family Medicine

## 2012-06-01 VITALS — BP 160/100 | HR 84 | Temp 98.1°F | Ht 67.75 in | Wt 231.8 lb

## 2012-06-01 DIAGNOSIS — E785 Hyperlipidemia, unspecified: Secondary | ICD-10-CM | POA: Insufficient documentation

## 2012-06-01 DIAGNOSIS — M6740 Ganglion, unspecified site: Secondary | ICD-10-CM

## 2012-06-01 DIAGNOSIS — I10 Essential (primary) hypertension: Secondary | ICD-10-CM

## 2012-06-01 DIAGNOSIS — E039 Hypothyroidism, unspecified: Secondary | ICD-10-CM

## 2012-06-01 DIAGNOSIS — M67439 Ganglion, unspecified wrist: Secondary | ICD-10-CM | POA: Insufficient documentation

## 2012-06-01 DIAGNOSIS — M67432 Ganglion, left wrist: Secondary | ICD-10-CM

## 2012-06-01 LAB — TSH: TSH: 2.23 u[IU]/mL (ref 0.35–5.50)

## 2012-06-01 MED ORDER — AMLODIPINE BESYLATE 5 MG PO TABS: 5.0000 mg | ORAL_TABLET | Freq: Every day | ORAL | Status: DC

## 2012-06-01 NOTE — Assessment & Plan Note (Signed)
New.  dx'd and started on Lipitor at last visit.  Tolerating statin w/out difficulty.  Check LFTs.

## 2012-06-01 NOTE — Assessment & Plan Note (Signed)
Improving but still not at goal.  Tolerating med w/out difficulty.  Due to fact pt has not been able to get BP to goal on Benicar HCT, will add amlodipine daily.  Pt expressed understanding and is in agreement w/ plan.

## 2012-06-01 NOTE — Patient Instructions (Addendum)
Follow up in 1 month to recheck BP Continue the Benicar HCT Add the Amlodipine We'll notify you of your lab results and make any changes if needed Someone will call you with your Hand Surgery appt Call with any questions or concerns Hang in there!!!

## 2012-06-01 NOTE — Progress Notes (Signed)
  Subjective:    Patient ID: Alicia Huff, female    DOB: 1965-05-20, 47 y.o.   MRN: [id number]  HPI HTN- chronic problem, started Benicar HCT at last visit.  No CP, SOB, HAs are improving, visual changes, edema.  Pt currently in a lot of pain and having emotional stress due to loss of husband's brother.  Hyperlipidemia- discovered at last visit, started on Lipitor.  Denies abd pain, N/V, myalgias  Hypothyroid- started on Synthroid at last visit.  Still having some fatigue but some improvement.  L wrist ganglion cyst- is enlarging and becoming painful   Review of Systems For ROS see HPI     Objective:   Physical Exam  Vitals reviewed. Constitutional: She is oriented to person, place, and time. She appears well-developed and well-nourished. No distress.  HENT:  Head: Normocephalic and atraumatic.  Eyes: Conjunctivae and EOM are normal. Pupils are equal, round, and reactive to light.  Neck: Normal range of motion. Neck supple. No thyromegaly present.  Cardiovascular: Normal rate, regular rhythm, normal heart sounds and intact distal pulses.   No murmur heard. Pulmonary/Chest: Effort normal and breath sounds normal. No respiratory distress.  Abdominal: Soft. She exhibits no distension. There is no tenderness.  Musculoskeletal: She exhibits no edema.  Dorsum of L wrist w/ 2 painful soft tissue masses consistent w/ ganglion cyst  Lymphadenopathy:    She has no cervical adenopathy.  Neurological: She is alert and oriented to person, place, and time.  Skin: Skin is warm and dry.  Psychiatric: She has a normal mood and affect. Her behavior is normal.          Assessment & Plan:

## 2012-06-01 NOTE — Assessment & Plan Note (Signed)
Now painful and enlarging.  Refer to hand specialist for complete evaluation and tx

## 2012-06-01 NOTE — Assessment & Plan Note (Signed)
New.  Pt started on synthroid at last visit.  Fatigue is improving but still present.  Check TSH to ensure dose is correct.  Titrate meds prn.

## 2012-06-04 LAB — HEPATIC FUNCTION PANEL
ALT: 17 U/L (ref 0–35)
AST: 28 U/L (ref 0–37)
Albumin: 4.2 g/dL (ref 3.5–5.2)

## 2012-06-04 LAB — BASIC METABOLIC PANEL
BUN: 11 mg/dL (ref 6–23)
Chloride: 101 mEq/L (ref 96–112)
Glucose, Bld: 91 mg/dL (ref 70–99)
Potassium: 3.2 mEq/L — ABNORMAL LOW (ref 3.5–5.1)

## 2012-06-15 ENCOUNTER — Encounter: Payer: Self-pay | Admitting: Family Medicine

## 2012-07-02 ENCOUNTER — Ambulatory Visit (INDEPENDENT_AMBULATORY_CARE_PROVIDER_SITE_OTHER): Payer: BC Managed Care – PPO | Admitting: Family Medicine

## 2012-07-02 ENCOUNTER — Encounter: Payer: Self-pay | Admitting: Family Medicine

## 2012-07-02 VITALS — BP 142/90 | HR 86 | Temp 98.2°F | Ht 67.75 in | Wt 233.2 lb

## 2012-07-02 DIAGNOSIS — F449 Dissociative and conversion disorder, unspecified: Secondary | ICD-10-CM | POA: Insufficient documentation

## 2012-07-02 DIAGNOSIS — R519 Headache, unspecified: Secondary | ICD-10-CM | POA: Insufficient documentation

## 2012-07-02 DIAGNOSIS — R51 Headache: Secondary | ICD-10-CM

## 2012-07-02 DIAGNOSIS — I10 Essential (primary) hypertension: Secondary | ICD-10-CM

## 2012-07-02 NOTE — Progress Notes (Signed)
  Subjective:    Patient ID: Alicia Huff, female    DOB: 05-23-1965, 47 y.o.   MRN: [id number]  HPI HTN- chronic problem, BP has come down another 20 pts w/ the addition of the amlodipine.  Also on Benicar HCT.  Pt reports HAs intermittently that start at crown of head and penetrates 'straight thru'.  Not occuring at night, but otherwise no pattern to HAs.  HAs will last ~7 minutes.  + nausea.  sxs have been present for 'years'.  Some dizziness.  Denies CP, SOB, visual changes.   Review of Systems For ROS see HPI     Objective:   Physical Exam  Vitals reviewed. Constitutional: She appears well-developed and well-nourished. She appears distressed (anxious, tearful).  HENT:  Head: Normocephalic and atraumatic.  Eyes: Conjunctivae and EOM are normal. Pupils are equal, round, and reactive to light.  Neck: Normal range of motion. Neck supple. No thyromegaly present.  Cardiovascular: Normal rate, regular rhythm, normal heart sounds and intact distal pulses.   No murmur heard. Pulmonary/Chest: Effort normal and breath sounds normal. No respiratory distress.  Abdominal: Soft. She exhibits no distension. There is no tenderness.  Musculoskeletal: She exhibits no edema.  Lymphadenopathy:    She has no cervical adenopathy.  Neurological: She is alert.  During OV, pt had brief episode of eyes glazing, staring straight ahead, not responsive to voice.  Pt then started to cry and was trembling when she said, 'ow my head hurts'.  Pt very shaken by episode, 'it's never happened at the doctor before and that's why i haven't mentioned'.  Skin: Skin is warm and dry.  Psychiatric: Her behavior is normal.  Tearful, anxious          Assessment & Plan:

## 2012-07-02 NOTE — Patient Instructions (Addendum)
We'll call you with your neuro appt Please bring your list of activities done while unresponsive to that appt Continue the BP meds- it's looking good! I know this is scary but we'll figure this out!

## 2012-07-02 NOTE — Assessment & Plan Note (Signed)
Improved w/ addition of Norvasc.  Close to goal today.  No med changes at this time.  Will continue to follow closely.

## 2012-07-02 NOTE — Assessment & Plan Note (Signed)
New.  Pt clearly embarrassed and upset by episode today.  Has never mentioned this to a provider before.  Apparently family has 'an entire list' of things that pt does during these episodes that she later has no recollection of.  Short episode witnessed in office today.  Question whether pt is having migraine variant vs seizures.  Refer to neuro ASAP for complete evaluation.  Cautioned against driving until dx is determined.

## 2012-07-02 NOTE — Assessment & Plan Note (Signed)
New to provider.  Pt describing HAs that are not consistent w/ typical sinus, BP, or tension HAs.  Reports she will have episodes of severe pain- 'like a stake being driven through the top of her head'.  Episodes are short lived but she is not responsive to verbal communication at these times.  Will refer to neuro for complete evaluation and tx.

## 2012-07-04 ENCOUNTER — Encounter: Payer: Self-pay | Admitting: Family Medicine

## 2012-07-04 ENCOUNTER — Telehealth: Payer: Self-pay | Admitting: General Practice

## 2012-07-04 NOTE — Telephone Encounter (Signed)
PA for Benicar started on 07-04-2012. Faxed to [facility] [phone]

## 2012-07-05 NOTE — Telephone Encounter (Signed)
PA for Benicar approved from 06-04-2012 to 07-04-2013. Sent to scanning, pharmacy notified.

## 2012-07-16 ENCOUNTER — Ambulatory Visit (INDEPENDENT_AMBULATORY_CARE_PROVIDER_SITE_OTHER): Payer: BC Managed Care – PPO | Admitting: Neurology

## 2012-07-16 ENCOUNTER — Encounter: Payer: Self-pay | Admitting: Neurology

## 2012-07-16 VITALS — BP 140/80 | HR 68 | Temp 98.1°F | Resp 16 | Ht 67.5 in | Wt 230.0 lb

## 2012-07-16 DIAGNOSIS — R569 Unspecified convulsions: Secondary | ICD-10-CM

## 2012-07-16 NOTE — Progress Notes (Signed)
Alicia Huff is a 47 year old woman with multiple lumbar spine surgeries including a fusion with residual back pain and pain radiating down the left leg when she lays on the left side. She was taking hydrocodone but now she is on Lyrica previously tried Neurontin.  She did not rest well and she never gets 8 hours and sometimes she wakes up gasping for breath.  She has headaches about twice a month that are on the very top of her head like a spike and they can last for 15 seconds to 20 minutes.  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  Her family has been concerned about staring spells. Her daughter has seen about 3 in the doctor also witnessed one a week or 2 ago. Dr. Mcneil Sober it patient was staring ahead with a glazed eyes and not responding very well to voice this was followed by a headache.  On 2 occasions she was driving. On one occasion she was driving on the highway for 10 minutes and she was driving without causing any danger but she was staring ahead and she wasn't responding properly to other peoples inquiries.  Her husband helped her to pull over and change places. According to her daughter she looked like a zombie.  On another occasion she may have been laying down she got up in set some words that were semi-incoherent and didn't quite make sense as he went back to bed later she didn't remember having done this at all.  Another occasion she was also in the bed she is walking through the room and she was stepping around an imaginary hole.  Again she didn't really remember this episode.  Her sister uses sleepwalking sleep talk with she was little but as far as she knows she didn't have a strong tendency to sleep walker self.  Review of symptoms is positive for high blood pressure, top of the crown headaches infrequently, multiple spine surgeries for spine issues with ongoing back pain and difficulty finding a  comfortable position at night, and he reported staring spells that the patient doesn't seem to have a complete memory for.  Past Medical History  Diagnosis Date  . Chicken pox   . Migraine   . Heart murmur   . Hypertension   . Anemia   . Retinoschisis   . Mitral valve prolapse   . Leg muscle spasm   . 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 amLODipine (NORVASC) 5 MG tablet Take 1 tablet (5 mg total) by mouth daily.  90 tablet  3  . atorvastatin (LIPITOR) 20 MG tablet Take 1 tablet (20 mg total) by mouth daily.  90 tablet  0  . levothyroxine (SYNTHROID, LEVOTHROID) 50 MCG tablet Take 1 tablet (50 mcg total) by mouth daily.  90 tablet  0  . olmesartan-hydrochlorothiazide (BENICAR HCT) 40-12.5 MG per tablet Take 1 tablet by mouth daily.  90 tablet  1  . potassium chloride SA (K-DUR,KLOR-CON) 20 MEQ tablet Take 20 mEq by mouth 2 (two) times daily.      . pregabalin (LYRICA) 50 MG capsule Take 50 mg by mouth 2 (two) times daily.       Marland Kitchen gabapentin (NEURONTIN) 100 MG capsule Take 1-3 capsules by mouth at bedtime.       No current facility-administered medications on  file prior to visit.   Review of patient's allergies indicates no known allergies.  History   Social History  . Marital Status: Married    Spouse Name: N/A    Number of Children: N/A  . Years of Education: N/A   Occupational History  . Not on file.   Social History Main Topics  . Smoking status: Never Smoker   . Smokeless tobacco: Never Used  . Alcohol Use: No  . Drug Use: No  . Sexually Active: Not on file   Other Topics Concern  . Not on file   Social History Narrative  . No narrative on file    Family History  Problem Relation Age of Onset  . Cancer Father   . Stroke Father   . Diabetes Father     BP 140/80  Pulse 68  Temp(Src) 98.1 F (36.7 C)  Resp 16  Ht 5' 7.5" (1.715 m)  Wt 230 lb (104.327 kg)  BMI  35.47 kg/m2  LMP 03/07/2012   Alert and oriented x 3.  Memory function appears to be intact.  Concentration and attention are normal for educational level and background.  Speech is fluent and without significant word finding difficulty.  Is aware of current events.  No carotid bruits detected.  Cranial nerve II through XII are within normal limits.  This includes normal optic discs and acuity, EOMI, PERLA, facial movement and sensation intact, hearing grossly intact, gag intact,Uvula raises symmetrically and tongue protrudes evenly. Motor strength is 5 over 5 throughout all limbs.  No atrophy, abnormal tone or tremors. Reflexes are 1-2+ and symmetric in the upper and lower extremities Sensory exam is intact. Coordination is intact for fine movements and rapid alternating movements in all limbs Gait and station are normal.   There is tenderness in the lumbar spine as well as over the left wrist due to a ganglion cyst.  Impression: 1. Staring spells.  She also has some episodes at slightly resemble sleepwalking behavior and sleep talking behavior which her sister pass as well.  Her sleep quality is definite poor and she wakes up gasping for breath there is a question of underlying sleep apnea.  Sometimes disruption to sleep due to chronic pain and other conditions could lead to a masking and usual sleep/wake behaviors.  Partial seizures would also be a possibility, but driving for 10 minutes of staring straight ahead would be a little atypical.  Plan: 1. We will do an overnight sleep study to rule out sleep apnea.  We can also asked for extra EEG electrodes lead coverage if this is offered to look for any evidence of seizure activity. 2. Return in 2 weeks for followup

## 2012-07-16 NOTE — Patient Instructions (Addendum)
I will call you with the appointment for your sleep evaluation.  Follow up with Dr. Smiley Houseman after the study is completed.

## 2012-07-17 ENCOUNTER — Encounter: Payer: Self-pay | Admitting: Family Medicine

## 2012-07-17 NOTE — Telephone Encounter (Signed)
Please advise.//AB/CMA 

## 2012-08-01 ENCOUNTER — Encounter: Payer: Self-pay | Admitting: Family Medicine

## 2012-08-06 ENCOUNTER — Encounter: Payer: Self-pay | Admitting: Family Medicine

## 2012-08-14 ENCOUNTER — Ambulatory Visit (HOSPITAL_BASED_OUTPATIENT_CLINIC_OR_DEPARTMENT_OTHER): Payer: BC Managed Care – PPO | Attending: Neurology | Admitting: Radiology

## 2012-08-14 VITALS — Ht 67.0 in | Wt 233.0 lb

## 2012-08-14 DIAGNOSIS — G4733 Obstructive sleep apnea (adult) (pediatric): Secondary | ICD-10-CM | POA: Insufficient documentation

## 2012-08-20 ENCOUNTER — Other Ambulatory Visit: Payer: Self-pay | Admitting: Rehabilitation

## 2012-08-20 DIAGNOSIS — M545 Low back pain: Secondary | ICD-10-CM

## 2012-08-22 DIAGNOSIS — G4733 Obstructive sleep apnea (adult) (pediatric): Secondary | ICD-10-CM

## 2012-08-22 NOTE — Procedures (Signed)
NAME:  Alicia Huff, Alicia Huff    ACCOUNT NO.:  [id number]  MEDICAL RECORD NO.:  [id number]          PATIENT TYPE:  OUT  LOCATION:  SLEEP CENTER                 FACILITY[facility]  [facility]  PHYSICIAN:  Barbaraann Share, MD,FCCPDATE OF BIRTH:  12-05-63  DATE OF STUDY:  08/14/2012                           NOCTURNAL POLYSOMNOGRAM  REFERRING PHYSICIAN:  MICHAEL L. SOO  INDICATION FOR STUDY:  Hypersomnia with sleep apnea.  EPWORTH SLEEPINESS SCORE:  17.  MEDICATIONS:  ORDERING PHYSICIAN:  Michael L. Soo, MD  SLEEP ARCHITECTURE:  The patient had a total sleep time of 257 minutes with no slow-wave sleep and only 51 minutes of REM.  Sleep onset latency was normal at 6.5 minutes and REM onset was normal at 105 minutes. Sleep efficiency was 84% during the diagnostic portion of the study and only 46% during the titration portion of the study.  RESPIRATORY DATA:  The patient underwent a split night protocol where she was found to have 80 obstructive and central events in the 1st 149 minutes of sleep.  This gave her an apnea-hypopnea index during the diagnostic portion of the study of 32 events per hour.  The patient slept in the supine position for the entire night, and there was moderate snoring noted throughout.  By protocol, she was fitted with a medium ResMed Mirage FX full-face mask, and CPAP titration was initiated.  She was completely intolerant of CPAP at a level of only 5 cm of water, and therefore was changed to bilevel.  This was ultimately titrated to a final pressure of 11/6, with good control of her events and snoring.  OXYGEN DATA:  There was O2 desaturation as low as 87% with the patient's obstructive events.  CARDIAC DATA:  Rare PVC noted, but no clinically significant arrhythmias were seen.  MOVEMENTS/PARASOMNIA:  The patient had no significant leg jerks or other abnormal behaviors noted.  IMPRESSION/RECOMMENDATIONS: 1. Split night study reveals moderate obstructive sleep  apnea, with an     AHI of 32 events per hour and oxygen desaturation as low as 87%.     She was then fitted with a medium ResMed Mirage FX full-face mask,     and found to be completely intolerant of CPAP.  She was therefore     changed to bilevel, and had an optimal pressure of     11/6 cm.  The patient should also be encouraged to work     aggressively on weight loss. 2. Rare PVC noted, but no clinically significant arrhythmias were     seen.     Barbaraann Share, MD,FCCP Diplomate, American Board of Sleep Medicine    [facility]  D:  08/22/2012 08:25:57  T:  08/22/2012 09:26:06  Job:  [id number]

## 2012-08-27 ENCOUNTER — Telehealth: Payer: Self-pay | Admitting: Neurology

## 2012-08-27 DIAGNOSIS — G4733 Obstructive sleep apnea (adult) (pediatric): Secondary | ICD-10-CM

## 2012-08-27 NOTE — Telephone Encounter (Signed)
Returned a call from the patient who had left me a voice mail message on 07/25 at 4:17 pm. We discussed her appointment sch with Dr. Everlena Cooper on 08/11 for her"spells" which she states she is still having from time to time. Regarding her recent dx of moderate OSA, we will refer her to [facility] so that she can f/u with that with the sleep specialist there. The patient is in favor with this plan. No additional questions/concerns voiced at this time.

## 2012-08-31 ENCOUNTER — Ambulatory Visit (INDEPENDENT_AMBULATORY_CARE_PROVIDER_SITE_OTHER): Payer: BC Managed Care – PPO | Admitting: Neurology

## 2012-08-31 ENCOUNTER — Encounter: Payer: Self-pay | Admitting: Neurology

## 2012-08-31 VITALS — BP 136/83 | HR 75 | Resp 16 | Ht 67.5 in | Wt 235.0 lb

## 2012-08-31 DIAGNOSIS — G4733 Obstructive sleep apnea (adult) (pediatric): Secondary | ICD-10-CM

## 2012-08-31 HISTORY — DX: Obstructive sleep apnea (adult) (pediatric): G47.33

## 2012-08-31 NOTE — Progress Notes (Signed)
[facility]  Provider:  Melvyn Novas, M D  Referring Provider: Sheliah Hatch, MD Primary Care Physician:  Neena Rhymes, MD  Chief Complaint  Patient presents with  . New Evaluation    Soo, OSA, rm 11   Sleep clinic consult:  HPI:  Alicia Huff is a 47 y.o. female  Is seen here as a referral/ revisit  from Dr. Beverely Low /Dr. Smiley Houseman .   [name], right-handed female patient was referred for a sleep study by Dr. Smiley Houseman after describing headaches headaches would be beginning in the morning but could last all day, she had some dissociative episodes possible dream intrusions and was known to be a loud snorer. She was also excessively daytime sleepy and endorsed an Epworth sleepiness score of 17 points at the sleep lab today here in the office at 14 points, which is still highly elevated. She mainly complains of fatigue. Chest pain, non cardiac, palpitations. Insomnia.    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 She goes to bed rather early 7:30 PM and is up at 3:30 she awakened spontaneously does not need an alarm.  Even on days when she may go to bed as late as midnight she will still wake up at 3:30. Average nocturnal sleep time is estimated to be within 6 hours, her sleep was fragmented due to to residual pain after a spinal fusion surgery. Pain is localized to the left arm and leg. Her husband reports that the patient has myoclonic jerks she stretching at night she is a rather restless sleeper and she herself is aware of multiple times at night trying to reposition her body to have less pain and discomfort. Her spinal surgery took place and December 2009 and her restless sleep precedes the time of surgery.  She rarely drinks coffee she may drink Tea-.uncaffeinated beverage throughout the day ,  not constant. She is a nonsmoker and  Nondrinker.   Her headaches are described as  localized to the very top of her hand and can be a sharp poking sensation that lasts 15 seconds but leaving her with abnormal sensation on the scalp for about 20 minutes. She describes feeling as if his bike was driven through the top of that.  Her family has also been concerned about staring spells her daughter had seen about 3 and her husband has visit with her but just one or 2. The patient seems to be staring disconnected to the surrounding ongoing activity appears glazed or impulse is not responding to verbal stimuli. On one occasion this occurred by she was driving on the highway. According to her daughter she looked very does oriented. Her husband was able to switch place as was her and continue the drive. The patient herself seems to be amnestic for these episodes. These have been infrequently occurring since 2006. Seemed to correlate with stress and sleep deprivation.   There's a family history of a sister that used to sleepwalk  And likely still does. She herself was not aware of any history of sleep walking and she was not aware of any parental history of a sleep disorder her father used to snore.  As the past medical history there has been no history of brain trauma or surgeries to the neck and airway. There she had never undergone a tonsillectomy or adenylate surgery. Dr. Smiley Houseman indicated that there was no labored respiration at the time  of his visit, her nasal septum was not deviated but the patient has mild micrognathia.    Review of Systems: Out of a complete 14 system review, the patient complains of only the following symptoms, and all other reviewed systems are negative. Sleepiness, fatigue and headaches.   History   Social History  . Marital Status: Married    Spouse Name: N/A    Number of Children: 4  . Years of Education: 14   Occupational History  . unemployed Advertising copywriter    in Psychologist, educational for disability    Social History Main Topics  . Smoking status: Never  Smoker   . Smokeless tobacco: Never Used  . Alcohol Use: No  . Drug Use: No  . Sexually Active: Not on file   Other Topics Concern  . Not on file   Social History Narrative  . No narrative on file    Family History  Problem Relation Age of Onset  . Cancer Father   . Stroke Father   . Diabetes Father     Past Medical History  Diagnosis Date  . Chicken pox   . Migraine   . Heart murmur   . Hypertension   . Anemia   . Retinoschisis   . Mitral valve prolapse   . Leg muscle spasm   . 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 High cholesterol     Past Surgical History  Procedure Laterality Date  . Breast surgery  1992 & 1993  . Hernia repair  1999    Umbilical repair  . Spine surgery  2009    fusion    Current Outpatient Prescriptions  Medication Sig Dispense Refill  . amLODipine (NORVASC) 5 MG tablet Take 1 tablet (5 mg total) by mouth daily.  90 tablet  3  . atorvastatin (LIPITOR) 20 MG tablet Take 1 tablet (20 mg total) by mouth daily.  90 tablet  0  . levothyroxine (SYNTHROID, LEVOTHROID) 50 MCG tablet Take 1 tablet (50 mcg total) by mouth daily.  90 tablet  0  . olmesartan-hydrochlorothiazide (BENICAR HCT) 40-12.5 MG per tablet Take 1 tablet by mouth daily.  90 tablet  1  . potassium chloride SA (K-DUR,KLOR-CON) 20 MEQ tablet Take 20 mEq by mouth 2 (two) times daily.      . diazepam (VALIUM) 10 MG tablet        No current facility-administered medications for this visit.    Allergies as of 08/31/2012  . (No Known Allergies)    Vitals: BP 136/83  Pulse 75  Resp 16  Ht 5' 7.5" (1.715 m)  Wt 235 lb (106.595 kg)  BMI 36.24 kg/m2 Last Weight:  Wt Readings from Last 1 Encounters:  08/31/12 235 lb (106.595 kg)   Last Height:   Ht Readings from Last 1 Encounters:  08/31/12 5' 7.5" (1.715 m)   BMI    Physical exam:  General: The patient is awake, alert and appears not in acute distress. The patient is well groomed. Head: Normocephalic, atraumatic.  Neck is supple. Mallampati 4 , neck circumference: 14 , no nasal deviation, no TMJ ,  Cardiovascular:  Regular rate and rhythm, without  murmurs or carotid bruit, and without distended neck veins. Respiratory: Lungs are clear to auscultation. Skin:  Without evidence of edema, or rash Trunk: BMI is 35,  elevated -this  patient  has normal posture.  Neurologic exam : The patient is awake and alert, oriented to place and time.  Memory subjective described  as intact. There is a normal attention span & concentration ability.  Speech is fluent without dysarthria, dysphonia or aphasia. Mood and affect are appropriate.  Cranial nerves: Pupils are equal and briskly reactive to light. Funduscopic exam without  evidence of pallor or edema . Extraocular movements  in vertical and horizontal planes intact and without nystagmus. Visual fields by finger perimetry are intact. Hearing to finger rub intact.  Facial sensation intact to fine touch. Facial motor strength is symmetric and tongue and uvula move midline.  Motor exam:   Normal tone and normal muscle bulk and symmetric normal strength in all extremities. No cogwheeling .  Sensory:  Fine touch, pinprick and vibration were tested in all extremities. She has a dysesthesia of burning , heat in the right leg ( L4-5 dermatome), on the left side there is straight leg rising induce hamstring pain and numbness with a deeper ache radiating to the lateral foot, L5-S1 .   Proprioception in the upper extremities is  normal.  Coordination: Rapid alternating movements in the fingers/hands is tested and normal. Finger-to-nose maneuver tested and normal without evidence of ataxia, dysmetria or tremor.  Gait and station: Patient walks without assistive device . Strength within normal limits. Stance is stable and normal. Tandem gait is unfragmented. Romberg testing is ormal.  Deep tendon reflexes: in the upper and lower extremities are attenuated - but  symmetric and  intact. Babinski maneuver downgoing.   Assessment:  After physical and neurologic examination, review of laboratory studies, imaging, neurophysiology testing and pre-existing records, assessment is   1) Obstructive sleep apnea, confirmed in a hospital based testing from 08-14-12, the patient reached an AHI of 32.3, a REM AHI of 66. She was titrated to, interestingly it appears that the patient was switched to BiPAP and titrated to 11 cm over 6 cm water but that she experienced multiple limb movements and limb jerks in a periodic fashion at the setting she did asked under at 10 cm inspiratory pressure and 6 cm expiratory pressure and slept for 22.5 minutes. The patient had felt suffocating under CPAP.  A nasal Eason mask was used.     Plan:  Treatment plan and additional workup : Initiate  Auto PAP from 5-10 cm water with 3 cm flex. Nasal mask.   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

## 2012-08-31 NOTE — Patient Instructions (Signed)
 CPAP and BIPAP CPAP and BIPAP are methods of helping you breathe. CPAP stands for "continuous positive airway pressure." BIPAP stands for "bi-level positive airway pressure." Both CPAP and BIPAP are provided by a small machine with a flexible plastic tube that attaches to a plastic mask that goes over your nose or mouth. Air is blown into your air passages through your nose or mouth. This helps to keep your airways open and helps to keep you breathing well. The amount of pressure that is used to blow the air into your air passages can be set on the machine. The pressure setting is based on your needs. With CPAP, the amount of pressure stays the same while you breathe in and out. With BIPAP, the amount of pressure changes when you inhale and exhale. Your caregiver will recommend whether CPAP or BIPAP would be more helpful for you.  CPAP and BIPAP can be helpful for both adults and children with:  Sleep apnea.  Chronic Obstructive Pulmonary Disease (COPD), a condition like emphysema.  Diseases which weaken the muscles of the chest such as muscular dystrophy or neurological diseases.  Other problems that cause breathing to be weak or difficult. USE OF CPAP OR BIPAP The respiratory therapist or technician will help you get used to wearing the mask. Some people feel claustrophobic (a trapped or closed in feeling) at first, because the mask needs to be fairly snug on your face.   It may help you to get used to the mask gradually, by first holding the mask loosely over your nose or mouth using a low pressure setting on the machine. Gradually the mask can be applied more snugly with increased pressure. You can also gradually increase the amount of time the mask is used.  People with sleep apnea will use the mask and machine at night when they are sleeping. Others, like those with ALS or other breathing difficulties, may need the CPAP or BIPAP all the time.  If the first mask you try does not fit well,  or is uncomfortable, there are other types and sizes that can be tried.  If you tend to breathe through your mouth, a chin strap may be applied to help keep your mouth closed (if you are using a nasal mask).  The CPAP and BIPAP machines have alarms that may sound if the mask comes off or develops a leak.  You should not eat or drink while the CPAP or BIPAP is on. Food or fluids could get pushed into your lungs by the pressure of the CPAP or BIPAP. Sometimes CPAP or BIPAP machines are ordered for home use. If you are going to use the CPAP or BIPAP machine at home, follow these instructions  CPAP or BIPAP machines can be rented or purchased through home health care companies. There are many different brands of machines available. If you rent a machine before purchasing you may find which particular machine works well for you.  Ask questions if there is something you do not understand when picking out your machine.  Place your CPAP or BIPAP machine on a secure table or stand near an electrical outlet.  Know where the On/Off switch is.  Follow your doctor's instructions for how to set the pressure on your machine and when you should use it.  Do not smoke! Tobacco smoke residue can damage the machine. SEEK IMMEDIATE MEDICAL CARE IF:   You have redness or open areas around your nose or mouth.  You have trouble   operating the CPAP or BIPAP machine.  You cannot tolerate wearing the CPAP or BIPAP mask.  You have any questions or concerns. Document Released: 10/16/2003 Document Revised: 04/11/2011 Document Reviewed: 01/15/2008 Exit[employer] Patient Information 2014 [employer]. Sleep Apnea Sleep apnea is disorder that affects a person's sleep. A person with sleep apnea has abnormal pauses in their breathing when they sleep. It is hard for them to get a good sleep. This makes a person tired during the day. It also can lead to other physical problems. There are three types of sleep apnea. One type  is when breathing stops for a short time because your airway is blocked (obstructive sleep apnea). Another type is when the brain sometimes fails to give the normal signal to breathe to the muscles that control your breathing (central sleep apnea). The third type is a combination of the other two types. HOME CARE  Do not sleep on your back. Try to sleep on your side.  Take all medicine as told by your doctor.  Avoid alcohol, calming medicines (sedatives), and depressant drugs.  Try to lose weight if you are overweight. Talk to your doctor about a healthy weight goal. Your doctor may have you use a device that helps to open your airway. It can help you get the air that you need. It is called a positive airway pressure (PAP) device. There are three types of PAP devices:  Continuous positive airway pressure (CPAP) device.  Nasal expiratory positive airway pressure (EPAP) device.  Bilevel positive airway pressure (BPAP) device. MAKE SURE YOU:  Understand these instructions.  Will watch your condition.  Will get help right away if you are not doing well or get worse. Document Released: 10/27/2007 Document Revised: 01/04/2012 Document Reviewed: 05/21/2011 [employer] Patient Information 2014 [employer]. Exercise to Lose Weight Exercise and a healthy diet may help you lose weight. Your doctor may suggest specific exercises. EXERCISE IDEAS AND TIPS  Choose low-cost things you enjoy doing, such as walking, bicycling, or exercising to workout videos.  Take stairs instead of the elevator.  Walk during your lunch break.  Park your car further away from work or school.  Go to a gym or an exercise class.  Start with 5 to 10 minutes of exercise each day. Build up to 30 minutes of exercise 4 to 6 days a week.  Wear shoes with good support and comfortable clothes.  Stretch before and after working out.  Work out until you breathe harder and your heart beats faster.  Drink extra water  when you exercise.  Do not do so much that you hurt yourself, feel dizzy, or get very short of breath. Exercises that burn about 150 calories:  Running 1  miles in 15 minutes.  Playing volleyball for 45 to 60 minutes.  Washing and waxing a car for 45 to 60 minutes.  Playing touch football for 45 minutes.  Walking 1  miles in 35 minutes.  Pushing a stroller 1  miles in 30 minutes.  Playing basketball for 30 minutes.  Raking leaves for 30 minutes.  Bicycling 5 miles in 30 minutes.  Walking 2 miles in 30 minutes.  Dancing for 30 minutes.  Shoveling snow for 15 minutes.  Swimming laps for 20 minutes.  Walking up stairs for 15 minutes.  Bicycling 4 miles in 15 minutes.  Gardening for 30 to 45 minutes.  Jumping rope for 15 minutes.  Washing windows or floors for 45 to 60 minutes. Document Released: 02/19/2010 Document Revised: 04/11/2011   Document Reviewed: 02/19/2010 [employer] Patient Information 2014 [employer].  

## 2012-09-03 ENCOUNTER — Ambulatory Visit
Admission: RE | Admit: 2012-09-03 | Discharge: 2012-09-03 | Disposition: A | Discharge status: Home / Self Care | Payer: BC Managed Care – PPO | Source: Ambulatory Visit | Attending: Rehabilitation | Admitting: Rehabilitation

## 2012-09-03 DIAGNOSIS — M545 Low back pain: Secondary | ICD-10-CM

## 2012-09-03 MED ORDER — GADOBENATE DIMEGLUMINE 529 MG/ML IV SOLN
20.0000 mL | Freq: Once | INTRAVENOUS | Status: AC | PRN
  Administered 2012-09-03: 20 mL via INTRAVENOUS

## 2012-09-10 ENCOUNTER — Ambulatory Visit: Payer: BC Managed Care – PPO | Admitting: Neurology

## 2012-09-18 ENCOUNTER — Telehealth: Payer: Self-pay | Admitting: Neurology

## 2012-09-18 NOTE — Telephone Encounter (Signed)
Patient is needing CPAP machine

## 2012-09-19 ENCOUNTER — Telehealth: Payer: Self-pay | Admitting: Neurology

## 2012-09-19 NOTE — Telephone Encounter (Signed)
AVS ready for patient pickup

## 2012-10-17 ENCOUNTER — Encounter: Payer: Self-pay | Admitting: Gynecology

## 2012-10-17 ENCOUNTER — Ambulatory Visit (INDEPENDENT_AMBULATORY_CARE_PROVIDER_SITE_OTHER): Payer: BC Managed Care – PPO | Admitting: Gynecology

## 2012-10-17 VITALS — BP 122/82 | HR 80 | Resp 18 | Ht 67.0 in | Wt 231.0 lb

## 2012-10-17 DIAGNOSIS — Z124 Encounter for screening for malignant neoplasm of cervix: Secondary | ICD-10-CM

## 2012-10-17 DIAGNOSIS — M545 Low back pain: Secondary | ICD-10-CM

## 2012-10-17 DIAGNOSIS — Z01419 Encounter for gynecological examination (general) (routine) without abnormal findings: Secondary | ICD-10-CM

## 2012-10-17 DIAGNOSIS — N898 Other specified noninflammatory disorders of vagina: Secondary | ICD-10-CM

## 2012-10-17 LAB — POCT WET PREP (WET MOUNT)

## 2012-10-17 MED ORDER — FLUCONAZOLE 150 MG PO TABS: 150.0000 mg | ORAL_TABLET | Freq: Once | ORAL | Status: DC

## 2012-10-17 NOTE — Progress Notes (Signed)
47 y.o.   Married    [place]   female   G4P4   here for annual exam.  Pt recently diagnosed with sleep apnea, still uncomfortable with CPAP.  Pt reports no cycle since 03/2012, prior 07/2011, no post-coital bleeding, occasional dyspareunia, external.  Pt reports that her uterus was noted enlarged on MRI, pt reports small fibroids on u/s 2012.  Pt with chronic low back pain and he\ad spinal fusion [id number] for ruptured disc. On cymbalta for chronic pain.  Some dryness, no lubrication.  Patient's last menstrual period was 03/03/2012.          Sexually active: yes  The current method of family planning is vasectomy.    Exercising:walking, Physical Therapy  Last mammogram:  2012 normal Last pap smear: 2011 neg History of abnormal pap: normal Smoking: never Alcohol: no Last colonoscopy:2009 normal, repeat in 10 years Last Bone Density:  never Last tetanus shot: 2010 Last cholesterol check: 07/2012 elevated, taking Atorvastatin  Hgb:     pcp          Urine: pcp   Family History  Problem Relation Age of Onset  . Cancer - Prostate Father   . Stroke Father   . Diabetes Father     Patient Active Problem List   Diagnosis Date Noted  . Obstructive sleep apnea (adult) (pediatric) 08/31/2012  . Headache(784.0) 07/02/2012  . 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 Other and unspecified hyperlipidemia 06/01/2012  . Unspecified hypothyroidism 06/01/2012  . Ganglion cyst of wrist 06/01/2012  . Amenorrhea 05/11/2011  . 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 HTN (hypertension) 05/11/2011  . S/P lumbar fusion 05/11/2011  . Chronic lumbar pain 05/11/2011    Past Medical History  Diagnosis Date  . Chicken pox   . Migraine   . Heart murmur   . Hypertension   . Anemia   . Retinoschisis   . Mitral valve prolapse   . Leg muscle spasm   . 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 High cholesterol   . Obstructive sleep apnea (adult) (pediatric) 08/31/2012  . Hernia, hiatal     Past Surgical History   Procedure Laterality Date  . Breast surgery  1992 & 1993  . Hernia repair  1999    Umbilical repair  . Spine surgery  2009    fusion    Allergies: Lyrica  Current Outpatient Prescriptions  Medication Sig Dispense Refill  . amLODipine (NORVASC) 5 MG tablet Take 1 tablet (5 mg total) by mouth daily.  90 tablet  3  . atorvastatin (LIPITOR) 20 MG tablet Take 1 tablet (20 mg total) by mouth daily.  90 tablet  0  . DULoxetine (CYMBALTA) 30 MG capsule Take 30 mg by mouth daily.      Marland Kitchen levothyroxine (SYNTHROID, LEVOTHROID) 50 MCG tablet Take 1 tablet (50 mcg total) by mouth daily.  90 tablet  0  . olmesartan-hydrochlorothiazide (BENICAR HCT) 40-12.5 MG per tablet Take 1 tablet by mouth daily.  90 tablet  1  . potassium chloride SA (K-DUR,KLOR-CON) 20 MEQ tablet Take 20 mEq by mouth 2 (two) times daily.       No current facility-administered medications for this visit.    ROS: Pertinent items are noted in HPI.  Social Hx:    Exam:    BP 122/82  Pulse 80  Resp 18  Ht 5\' 7"  (1.702 m)  Wt 231 lb (104.781 kg)  BMI 36.17 kg/m2  LMP 03/03/2012   Wt Readings from Last 3 Encounters:  10/17/12 231 lb (104.781 kg)  08/31/12 235 lb (106.595 kg)  08/14/12 233 lb (105.688 kg)     Ht Readings from Last 3 Encounters:  10/17/12 5\' 7"  (1.702 m)  08/31/12 5' 7.5" (1.715 m)  08/14/12 5\' 7"  (1.702 m)    General appearance: alert, cooperative and appears stated age Head: Normocephalic, without obvious abnormality, atraumatic Neck: no adenopathy, supple, symmetrical, trachea midline and thyroid not enlarged, symmetric, no tenderness/mass/nodules Lungs: clear to auscultation bilaterally Breasts: Inspection negative, No nipple retraction or dimpling, No nipple discharge or bleeding, No axillary or supraclavicular adenopathy, Normal to palpation without dominant masses Heart: regular rate and rhythm Abdomen: soft, non-tender; bowel sounds normal; no masses,  no organomegaly Extremities:  extremities normal, atraumatic, no cyanosis or edema Skin: Skin color, texture, turgor normal. No rashes or lesions Lymph nodes: Cervical, supraclavicular, and axillary nodes normal. No abnormal inguinal nodes palpated Neurologic: Grossly normal   Pelvic: External genitalia:  no lesions, scant white discharge              Urethra:  normal appearing urethra with no masses, tenderness or lesions              Bartholins and Skenes: normal                 Vagina: normal appearing vagina with normal color and white discharge thin, no lesions              Cervix: normal appearance              Pap taken: yes        Bimanual Exam:  Uterus:  uterus is normal size, shape, consistency and nontender-limited by anxiety                                      Adnexa: normal adnexa in size, nontender and no masses                                      Rectovaginal: Confirms                                      Anus:  normal sphincter tone, no lesions  A: normal menopausal exam Low back pain, enlarged uterus by MRI Vaginal/vulvar discharge     P:     Mammogram-overdue, stressed Exam limited today-MRI 07/2012 with "enlarged" uterus-will get u/s Wp: few yeast-diflucan pap smear with HRHPV return annually or prn     An After Visit Summary was printed and given to the patient.

## 2012-10-19 LAB — IPS PAP TEST WITH HPV

## 2012-10-22 ENCOUNTER — Telehealth: Payer: Self-pay | Admitting: Gynecology

## 2012-10-22 NOTE — Telephone Encounter (Signed)
LMTCB to discuss ins benefits and schedule PUS.  °

## 2012-10-23 ENCOUNTER — Encounter: Payer: Self-pay | Admitting: Neurology

## 2012-10-23 NOTE — Telephone Encounter (Signed)
Spoke to patient. Discussed ins benefits and scheduled a PUS on 9/30

## 2012-10-26 ENCOUNTER — Ambulatory Visit: Payer: BC Managed Care – PPO | Admitting: Neurology

## 2012-10-29 ENCOUNTER — Encounter: Payer: Self-pay | Admitting: Neurology

## 2012-10-29 ENCOUNTER — Ambulatory Visit (INDEPENDENT_AMBULATORY_CARE_PROVIDER_SITE_OTHER): Payer: BC Managed Care – PPO | Admitting: Neurology

## 2012-10-29 VITALS — BP 145/97 | HR 70 | Resp 16 | Ht 67.0 in | Wt 237.0 lb

## 2012-10-29 DIAGNOSIS — G4730 Sleep apnea, unspecified: Secondary | ICD-10-CM

## 2012-10-29 DIAGNOSIS — G4733 Obstructive sleep apnea (adult) (pediatric): Secondary | ICD-10-CM

## 2012-10-29 NOTE — Patient Instructions (Signed)
Exercise to Lose Weight Exercise and a healthy diet may help you lose weight. Your doctor may suggest specific exercises. EXERCISE IDEAS AND TIPS  Choose low-cost things you enjoy doing, such as walking, bicycling, or exercising to workout videos.  Take stairs instead of the elevator.  Walk during your lunch break.  Park your car further away from work or school.  Go to a gym or an exercise class.  Start with 5 to 10 minutes of exercise each day. Build up to 30 minutes of exercise 4 to 6 days a week.  Wear shoes with good support and comfortable clothes.  Stretch before and after working out.  Work out until you breathe harder and your heart beats faster.  Drink extra water when you exercise.  Do not do so much that you hurt yourself, feel dizzy, or get very short of breath. Exercises that burn about 150 calories:  Running 1  miles in 15 minutes.  Playing volleyball for 45 to 60 minutes.  Washing and waxing a car for 45 to 60 minutes.  Playing touch football for 45 minutes.  Walking 1  miles in 35 minutes.  Pushing a stroller 1  miles in 30 minutes.  Playing basketball for 30 minutes.  Raking leaves for 30 minutes.  Bicycling 5 miles in 30 minutes.  Walking 2 miles in 30 minutes.  Dancing for 30 minutes.  Shoveling snow for 15 minutes.  Swimming laps for 20 minutes.  Walking up stairs for 15 minutes.  Bicycling 4 miles in 15 minutes.  Gardening for 30 to 45 minutes.  Jumping rope for 15 minutes.  Washing windows or floors for 45 to 60 minutes. Document Released: 02/19/2010 Document Revised: 04/11/2011 Document Reviewed: 02/19/2010 [employer] Patient Information 2014 [employer]. Sleep Apnea  Sleep apnea is a sleep disorder characterized by abnormal pauses in breathing while you sleep. When your breathing pauses, the level of oxygen in your blood decreases. This causes you to move out of deep sleep and into light sleep. As a result, your quality of  sleep is poor, and the system that carries your blood throughout your body (cardiovascular system) experiences stress. If sleep apnea remains untreated, the following conditions can develop:  High blood pressure (hypertension).  Coronary artery disease.  Inability to achieve or maintain an erection (impotence).  Impairment of your thought process (cognitive dysfunction). There are three types of sleep apnea: 1. Obstructive sleep apnea Pauses in breathing during sleep because of a blocked airway. 2. Central sleep apnea Pauses in breathing during sleep because the area of the brain that controls your breathing does not send the correct signals to the muscles that control breathing. 3. Mixed sleep apnea A combination of both obstructive and central sleep apnea. RISK FACTORS The following risk factors can increase your risk of developing sleep apnea:  Being overweight.  Smoking.  Having narrow passages in your nose and throat.  Being of older age.  Being female.  Alcohol use.  Sedative and tranquilizer use.  Ethnicity. Among individuals younger than 35 years, African Americans are at increased risk of sleep apnea. SYMPTOMS   Difficulty staying asleep.  Daytime sleepiness and fatigue.  Loss of energy.  Irritability.  Loud, heavy snoring.  Morning headaches.  Trouble concentrating.  Forgetfulness.  Decreased interest in sex. DIAGNOSIS  In order to diagnose sleep apnea, your caregiver will perform a physical examination. Your caregiver may suggest that you take a home sleep test. Your caregiver may also recommend that you spend the   night in a sleep lab. In the sleep lab, several monitors record information about your heart, lungs, and brain while you sleep. Your leg and arm movements and blood oxygen level are also recorded. TREATMENT The following actions may help to resolve mild sleep apnea:  Sleeping on your side.   Using a decongestant if you have nasal  congestion.   Avoiding the use of depressants, including alcohol, sedatives, and narcotics.   Losing weight and modifying your diet if you are overweight. There also are devices and treatments to help open your airway:  Oral appliances. These are custom-made mouthpieces that shift your lower jaw forward and slightly open your bite. This opens your airway.  Devices that create positive airway pressure. This positive pressure "splints" your airway open to help you breathe better during sleep. The following devices create positive airway pressure:  Continuous positive airway pressure (CPAP) device. The CPAP device creates a continuous level of air pressure with an air pump. The air is delivered to your airway through a mask while you sleep. This continuous pressure keeps your airway open.  Nasal expiratory positive airway pressure (EPAP) device. The EPAP device creates positive air pressure as you exhale. The device consists of single-use valves, which are inserted into each nostril and held in place by adhesive. The valves create very little resistance when you inhale but create much more resistance when you exhale. That increased resistance creates the positive airway pressure. This positive pressure while you exhale keeps your airway open, making it easier to breath when you inhale again.  Bilevel positive airway pressure (BPAP) device. The BPAP device is used mainly in patients with central sleep apnea. This device is similar to the CPAP device because it also uses an air pump to deliver continuous air pressure through a mask. However, with the BPAP machine, the pressure is set at two different levels. The pressure when you exhale is lower than the pressure when you inhale.  Surgery. Typically, surgery is only done if you cannot comply with less invasive treatments or if the less invasive treatments do not improve your condition. Surgery involves removing excess tissue in your airway to create a  wider passage way. Document Released: 01/07/2002 Document Revised: 07/19/2011 Document Reviewed: 05/26/2011 [employer] Patient Information 2014 [employer].  

## 2012-10-29 NOTE — Progress Notes (Signed)
Chief Complaint  Patient presents with  . Follow-up    rm 10,Bipap   [name], right-handed female patient was referred for a sleep study by Dr. Smiley Houseman,  after describing headaches in the morning , snoring and EDS, and is now followed by Dr. Shon Millet.   Headaches would be beginning in the morning, could last all day and she had some dissociative episodes ( possible dream intrusions) . She is known to be a loud snorer.  She was also excessively daytime sleepy and endorsed an Epworth sleepiness score of 17 points at the sleep lab visit, than in the office at 14 points, and today at 17 points again.  FSS over 40 .  She feels better than before PAP therapy.  She mainly complains of fatigue. Chest pain, non cardiac, palpitations. Insomnia. Her gynecologist , Douglass Rivers ,  found a murmur and she will see Dr.Weintraub on October 7.th for a Visit. The chest pain and fatigue may be related/  Patient is a veteran and is to used to shiftwork and basically has not regained  her sleep habits from that time on . She goes to bed rather early 7:30 PM and is up at 3:30 she awakened spontaneously does not need an alarm.  Even on days when she may go to bed as late as midnight she will still wake up at 3:30.  Average nocturnal sleep time is estimated to be within 6 hours, her sleep was fragmented due to to residual pain after a spinal fusion surgery. Pain is localized to the left arm and leg.  Her family has also been concerned about staring spells her daughter had seen about 3 and her husband has visit with her but just one or 2. The patient seems to be staring disconnected to the surrounding ongoing activity appears glazed or impulse is not responding to verbal stimuli. On one occasion this occurred by she was driving on the highway. According to her daughter she looked very does oriented. Her husband was able to switch place as was her and continue the drive. The patient herself seems to be amnestic for  these episodes.  These have been infrequently occurring since 2006. Seemed to correlate with stress and sleep deprivation.  There's a family history of a sister that used to sleepwalk And likely still does. She herself was not aware of any history of sleep walking and she was not aware of any parental history of a sleep disorder her father used to snore.  As the past medical history there has been no history of brain trauma or surgeries to the neck and airway.  There she had never undergone a tonsillectomy or adenoids surgery. Dr. Smiley Houseman indicated that there was no labored respiration at the time of his visit, her nasal septum was not deviated but the patient has mild micrognathia.  Meanwhile , Mrs. Ty Hilts had undergone a sleep study 08-13-12 at [facility] , interpreted by Dr. Shelle Iron. Sleep study revealed 96% sleep efficiency an AHI of 32.3 and an RDI of 44.8. She was titrated first of CPAP but seemed to have responded to this very is central apneas. She was finally titrated to 10/6 cm water and then 11/6 cm water with an AHI of 0 a sleep efficiency of 87% the total sleep time of only 10 minutes of which moles was REM sleep was observed. I was also today reviewing an old of office download to advanced on care, for 30 days of initial use of  CPAP or BiPAP and her case was issue at 09/22/2012 first 30 days shortly 97% he was 90% of these days are over 4 hours.  Average therapy time per day as 5 hours and 18 minutes and her residual AHI is 0.8. The patient is an older set machine right now with a maximum pressure of 11 and a minimum pressure of 4 cm water and in EPR of 3.  The 95th percentile pressure is 7.6 cm H20, - the machine could  be set at 8 (which is the closest pressure available),  but if her insurance allows her to stay on auto- set machine, I would be in favor of continuing to use as.  Her husband has noticed that she snores less or not at all, and she feels better rested in the morning, she has a  left dry mouth in the morning. Last night she still had 5 bathroom breaks but this is not rule, in general it is to bathroom breaks. Prior to using the machine she had 25 bathroom breaks. Again today's Epworth 17 points and theFS S.  score 54 points. She will undergo a cardiology evaluation next week established which complement of fatigue could be cardiac related given the discovery of [place]. Given that her baseline obstructive sleep apnea was moderate to severe would like the patient to continue the therapy at the current settings and was at times must but seems to do well and produces not a high air leak. She uses a nasal mask, not pillows. Phillips respironics model.     Physical exam:  General: The patient is awake, alert and appears not in acute distress. The patient is well groomed.  Head: Normocephalic, atraumatic. Neck is supple. Mallampati 4 , neck circumference: 14 , no nasal deviation, no TMJ ,  Cardiovascular: Regular rate and rhythm, without  carotid bruit, and without distended neck veins.   Murmur is not a click( not audible to me) there are some skipped beats ,  Split S2 sound.  Respiratory: Lungs are clear to auscultation.  Skin: Without evidence of edema, or rash  Trunk: BMI is 35, elevated. Neurologic exam :  The patient is awake and alert, oriented to place and time. Memory subjective described as intact. There is a normal attention span & concentration ability.  Speech is fluent without dysarthria, dysphonia or aphasia. Mood and affect are appropriate. She is worried about the cardiac murmur.  Cranial nerves:  Pupils are equal and briskly reactive to light. Funduscopic exam without evidence of pallor or edema  . Extraocular movements in vertical and horizontal planes intact and without nystagmus. Visual fields by finger perimetry are intact.  Hearing to finger rub intact. Facial sensation intact to fine touch. Facial motor strength is symmetric and tongue and uvula move  midline.  Motor exam: Normal tone and normal muscle bulk and symmetric normal strength in all extremities. No cogwheeling .  Sensory: Fine touch, pinprick and vibration were tested in all extremities.  She has a dysesthesia of burning , heat in the right leg ( L4-5 dermatome), on the left side there is straight leg rising induce hamstring pain and numbness with a deeper ache radiating to the lateral foot, L5-S1 . Proprioception in the upper extremities is normal.  Coordination: Rapid alternating movements in the fingers/hands is tested and normal. Finger-to-nose maneuver tested and normal without evidence of ataxia, dysmetria or tremor.  Gait and station: Patient walks without assistive device . Strength within normal limits. Stance is stable and normal.  Tandem gait is unfragmented. Romberg testing is ormal.  Deep tendon reflexes: in the upper and lower extremities are attenuated - but symmetric and intact. Babinski maneuver downgoing.   Assessment:   1) Obstructive sleep apnea, confirmed in a hospital based testing from 08-14-12, the patient reached an AHI of 32.3, a REM AHI of 66.  A nasal Eason mask was used.  2) leg pain treated by Dr. Everlena Cooper.  3) chest pain treated by Dr Alanda Amass and  Dr. Allyson Sabal, [facility] center  . Plan: Treatment plan and additional workup :  Auto PAP from 5-10 cm water with 3 cm flex. Nasal mask. Patient responded well, now  residual AHi of only 0.8, 95% is at 7.6  cm water.  No airleaks.

## 2012-10-30 ENCOUNTER — Ambulatory Visit (INDEPENDENT_AMBULATORY_CARE_PROVIDER_SITE_OTHER): Payer: BC Managed Care – PPO | Admitting: Gynecology

## 2012-10-30 ENCOUNTER — Ambulatory Visit (INDEPENDENT_AMBULATORY_CARE_PROVIDER_SITE_OTHER): Payer: BC Managed Care – PPO

## 2012-10-30 VITALS — BP 120/76 | HR 78 | Resp 16 | Wt 232.0 lb

## 2012-10-30 DIAGNOSIS — M545 Low back pain: Secondary | ICD-10-CM

## 2012-10-30 DIAGNOSIS — N9489 Other specified conditions associated with female genital organs and menstrual cycle: Secondary | ICD-10-CM

## 2012-10-30 DIAGNOSIS — D259 Leiomyoma of uterus, unspecified: Secondary | ICD-10-CM

## 2012-10-30 NOTE — Progress Notes (Signed)
      Pt presents with husband for u/s done to evaluate pelvis with history of fibroids noted on imaging for low back pain.  She is perimenopausal.   U/S images reviewed with both.  Fibroids noted and are small and unlikely to be the source of her pain, which probably is related to her L4L5 fusion. aslo noted on u/s was a probable endometrial mass with a feeder vessel seen, polyp suspected.  Discussed polyps, although she is currently not having menorrhagia both would like to have it removed. We reviewed hysteroscopic resection, suggest using the tru-clear.  Risks of bleeding, infection, uterine perforation, complications from anesthesia, lower extremity clotting and pulomary emboli discussed.  Risks from distending media reviewed but less with tru clear than resectoscope. They would like to proceed. They will pursue alternative treatment for her back pain. Length of time spend discussing management of endometrial polyps 19m

## 2012-10-31 ENCOUNTER — Encounter: Payer: Self-pay | Admitting: Gynecology

## 2012-11-06 ENCOUNTER — Encounter: Payer: Self-pay | Admitting: Cardiovascular Disease

## 2012-11-06 ENCOUNTER — Ambulatory Visit (INDEPENDENT_AMBULATORY_CARE_PROVIDER_SITE_OTHER): Payer: BC Managed Care – PPO | Admitting: Cardiovascular Disease

## 2012-11-06 VITALS — BP 164/100 | HR 59 | Ht 67.5 in | Wt 237.4 lb

## 2012-11-06 DIAGNOSIS — I341 Nonrheumatic mitral (valve) prolapse: Secondary | ICD-10-CM

## 2012-11-06 DIAGNOSIS — I059 Rheumatic mitral valve disease, unspecified: Secondary | ICD-10-CM

## 2012-11-06 DIAGNOSIS — E785 Hyperlipidemia, unspecified: Secondary | ICD-10-CM

## 2012-11-06 DIAGNOSIS — I10 Essential (primary) hypertension: Secondary | ICD-10-CM

## 2012-11-06 DIAGNOSIS — Z8679 Personal history of other diseases of the circulatory system: Secondary | ICD-10-CM | POA: Insufficient documentation

## 2012-11-06 DIAGNOSIS — R0789 Other chest pain: Secondary | ICD-10-CM

## 2012-11-06 NOTE — Progress Notes (Signed)
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 She is self referred to be established in our practice to there is no family history. She does not smoke. She does have a productive sleep apnea which was diagnosed and currently wears CPAP. She is occasional chest pain which is atypical but at rest and with exertion. It is fleeting and does not radiate. There is a long history of "mitral valve prolapse" diagnosed by her previous cardiologist remotely in the 90s.   Current Outpatient Prescriptions  Medication Sig Dispense Refill  . amLODipine (NORVASC) 5 MG tablet Take 1 tablet (5 mg total) by mouth daily.  90 tablet  3  . atorvastatin (LIPITOR) 20 MG tablet Take 1 tablet (20 mg total) by mouth daily.  90 tablet  0  . DULoxetine (CYMBALTA) 30 MG capsule Take 30 mg by mouth daily.      Marland Kitchen levothyroxine (SYNTHROID, LEVOTHROID) 50 MCG tablet Take 1 tablet (50 mcg total) by mouth daily.  90 tablet  0  . olmesartan-hydrochlorothiazide (BENICAR HCT) 40-12.5 MG per tablet Take 1 tablet by mouth daily.  90 tablet  1  . OVER THE COUNTER MEDICATION CPAP therapy      . potassium chloride SA (K-DUR,KLOR-CON) 20 MEQ tablet Take 20 mEq by mouth 2 (two) times daily.       No current facility-administered medications for this visit.    Allergies  Allergen Reactions  . Lyrica [Pregabalin]     Hallcinations    History   Social History  . Marital Status: Married    Spouse Name: N/A    Number of Children: 4  . Years of Education: 14   Occupational History  . unemployed Advertising copywriter    in Psychologist, educational for disability    Social History Main Topics  . Smoking status: Never Smoker   . Smokeless tobacco: Never Used    . Alcohol Use: No  . Drug Use: No  . Sexual Activity: Yes    Partners: Male    Birth Control/ Protection: Other-see comments     Comment: vasectomy   Other Topics Concern  . Not on file   Social History Narrative  . No narrative on file     Review of Systems: General: negative for chills, fever, night sweats or weight changes.  Cardiovascular: negative for chest pain, dyspnea on exertion, edema, orthopnea, palpitations, paroxysmal nocturnal dyspnea or shortness of breath Dermatological: negative for rash Respiratory: negative for cough or wheezing Urologic: negative for hematuria Abdominal: negative for nausea, vomiting, diarrhea, bright red blood per rectum, melena, or hematemesis Neurologic: negative for visual changes, syncope, or dizziness All other systems reviewed and are otherwise negative except as noted above.    Blood pressure 164/100, pulse 59, height 5' 7.5" (1.715 m), weight 237 lb 6.4 oz (107.684 kg), last menstrual period 03/03/2012.  General appearance: alert and no distress Neck: no adenopathy, no carotid bruit, no JVD, supple, symmetrical, trachea midline and thyroid not enlarged, symmetric, no tenderness/mass/nodules Lungs: clear to auscultation bilaterally Heart: regular rate and rhythm, S1, S2 normal, no murmur, click, rub or gallop Abdomen: soft, non-tender; bowel sounds normal; no masses,  no organomegaly Extremities: extremities normal, atraumatic, no cyanosis or edema Pulses: 2+ and symmetric  EKG sinus bradycardia of 59 without ST or T wave changes  ASSESSMENT AND PLAN:   History of mitral valve prolapse Patient has a long history of mitral valve prolapse dating back to Dr. Carin Hock. Clelia Croft who is her cardiologist in the past.  She has not not had this  Assessed recently. She has been told that she has a murmur.  Atypical chest pain Occurs both at rest and with exertion. It is brief without radiation. It has awakened her from sleep rarely. Does  not sound anginal.  HTN (hypertension) On oral antihypertensive medications. Her blood pressure is elevated today though one week ago it was normal. She does admit to "a white coat component".  Hyperlipidemia On statin therapy followed by her PCP. She is not at goal      Runell Gess MD [facility] 11/06/2012 9:28 AM

## 2012-11-06 NOTE — Assessment & Plan Note (Signed)
Occurs both at rest and with exertion. It is brief without radiation. It has awakened her from sleep rarely. Does not sound anginal.

## 2012-11-06 NOTE — Assessment & Plan Note (Signed)
On oral antihypertensive medications. Her blood pressure is elevated today though one week ago it was normal. She does admit to "a white coat component".

## 2012-11-06 NOTE — Assessment & Plan Note (Signed)
Patient has a long history of mitral valve prolapse dating back to Dr. Carin Hock. Alicia Huff who is her cardiologist in the past.  She has not not had this  Assessed recently. She has been told that she has a murmur.

## 2012-11-06 NOTE — Patient Instructions (Signed)
Your physician has requested that you have an echocardiogram. Echocardiography is a painless test that uses sound waves to create images of your heart. It provides your doctor with information about the size and shape of your heart and how well your heart's chambers and valves are working. This procedure takes approximately one hour. There are no restrictions for this procedure.  Your physician recommends that you schedule a follow-up appointment in: 1 YEAR

## 2012-11-06 NOTE — Assessment & Plan Note (Signed)
On statin therapy followed by her PCP. She is not at goal

## 2012-11-07 ENCOUNTER — Encounter: Payer: Self-pay | Admitting: Cardiovascular Disease

## 2012-11-07 ENCOUNTER — Telehealth: Payer: Self-pay | Admitting: Gynecology

## 2012-11-07 NOTE — Telephone Encounter (Signed)
Patient is ready to schedule procedure.  

## 2012-11-07 NOTE — Telephone Encounter (Signed)
Routing to [place] for surgery scheduling. Message left to advise will call when back in office.

## 2012-11-12 NOTE — Telephone Encounter (Signed)
Discussed patient's benefits for surgery. Patient ready to schedule. Wants surgery before the end of this year.

## 2012-11-13 NOTE — Progress Notes (Signed)
Quick Note:  I reviewed the patient's CPAP compliance data from September 2014 , which is a total of 30 days, during which time the patient used CPAP every day except for The average usage for all days was over 5 hours The percent used days was 97 % , and time greater than 4 hours indicating 97% compliance.  The residual AHI was 0.8/ hour, indicating an sufficient treatment.Results for orders placed in visit on 10/17/12  -POCT WET PREP (WET MOUNT)  Source Wet Prep POC vagina   WBC, Wet Prep HPF POC   Bacteria Wet Prep HPF POC   BACTERIA WET PREP MORPHOLOGY POC   Clue Cells Wet Prep HPF POC None   CLUE CELLS WET PREP WHIFF POC   Yeast Wet Prep HPF POC Few   KOH Wet Prep POC   Trichomonas Wet Prep HPF POC none   -IPS PAP TEST WITH HPV  COMMENTS: Study was performed on auto-titration , between 4 and 11 cm water pressure, the 95% pressure was 7.6 cm water pressure with EPR of 2 cm . I will review this data with the patient at the next office visit, provide feedback and additional troubleshooting if need be. The machine can be set to 7.5 cm water .  Melvyn Novas , MD  [facility]  ______

## 2012-11-15 ENCOUNTER — Encounter: Payer: Self-pay | Admitting: Neurology

## 2012-11-15 NOTE — Telephone Encounter (Signed)
Case request sent for 11-28-12.

## 2012-11-16 NOTE — Telephone Encounter (Signed)
Requested date was not available (at hospital). Rescheduled to 12-10-12 at 1000 at [facility].  Call to patient, VM has number confirmation, LM procedure scheduled for 12-10-12 and call back on Monday.

## 2012-11-20 ENCOUNTER — Ambulatory Visit (HOSPITAL_COMMUNITY)
Admission: RE | Admit: 2012-11-20 | Discharge: 2012-11-20 | Disposition: A | Discharge status: Home / Self Care | Payer: BC Managed Care – PPO | Source: Ambulatory Visit | Attending: Cardiovascular Disease | Admitting: Cardiovascular Disease

## 2012-11-20 DIAGNOSIS — I341 Nonrheumatic mitral (valve) prolapse: Secondary | ICD-10-CM

## 2012-11-20 DIAGNOSIS — I059 Rheumatic mitral valve disease, unspecified: Secondary | ICD-10-CM | POA: Insufficient documentation

## 2012-11-20 NOTE — Progress Notes (Signed)
2D Echo Performed 11/20/2012    Wendel Homeyer, RCS  

## 2012-11-22 ENCOUNTER — Encounter (HOSPITAL_COMMUNITY): Payer: Self-pay

## 2012-11-24 ENCOUNTER — Encounter: Payer: Self-pay | Admitting: *Deleted

## 2012-11-26 ENCOUNTER — Encounter (HOSPITAL_COMMUNITY): Payer: Self-pay | Admitting: Pharmacist

## 2012-11-27 ENCOUNTER — Encounter: Payer: Self-pay | Admitting: *Deleted

## 2012-11-28 ENCOUNTER — Ambulatory Visit (INDEPENDENT_AMBULATORY_CARE_PROVIDER_SITE_OTHER): Payer: BC Managed Care – PPO | Admitting: Gynecology

## 2012-11-28 ENCOUNTER — Encounter: Payer: Self-pay | Admitting: Gynecology

## 2012-11-28 VITALS — BP 126/84 | HR 76 | Resp 18 | Ht 67.0 in | Wt 241.0 lb

## 2012-11-28 DIAGNOSIS — N9489 Other specified conditions associated with female genital organs and menstrual cycle: Secondary | ICD-10-CM

## 2012-11-28 NOTE — Telephone Encounter (Signed)
Patient had pre-op visit today but I did not see her.  Called patient to review surgery instructions and copy mailed. Denies question.  Routed to provider for review. Patient agreeable to disposition, encounter closed.

## 2012-11-28 NOTE — Progress Notes (Signed)
  47 y.o.MarriedNot Hispanic or Latinofemale presents for preoperative consult for D&C hysteroscopy with tru-clear resectoscope for endometrial mass.   Pertinent items are noted in HPI.  BP 126/84  Pulse 76  Resp 18  Ht 5' 7" (1.702 m)  Wt 241 lb (109.317 kg)  BMI 37.74 kg/m2 General appearance: alert, cooperative and appears stated age Lungs: clear to auscultation bilaterally Heart: regular rate and rhythm, S1, S2 normal, no murmur, click, rub or gallop Abdomen: soft, non-tender; bowel sounds normal; no masses,  no organomegaly Neurologic: Grossly normal , alert and oriented x3  The procedure was discussed at length, she previewed a few you tube videos and felt very comfortable with the procedure.  Pt informed regarding risks and benefits of surgery, including but not limited to bleeding, infections, damage to bowel or bladder due to uterine perforation either during the procedure or during dilation.  A medicationwill be be given preoperatively to soften the cervix.    There is a risk of formation of a deep vein thrombus in the extremities was discussed, although PAS will be placed to minimize the risk, the patient was informed that a pulmonary embolism could still form and could result in death.  She was instructed on signs and symptoms to be aware of and the need to call if they should develop.  Fluid overload from the distending media was discussed as was the usual intra-operative safety precautions in place.  All questions were addressed.   

## 2012-11-28 NOTE — Patient Instructions (Signed)
Nothing to eat or drink after midnight Medications with sips of water only

## 2012-12-04 ENCOUNTER — Encounter (HOSPITAL_COMMUNITY)
Admission: RE | Admit: 2012-12-04 | Discharge: 2012-12-04 | Disposition: A | Discharge status: Home / Self Care | Payer: BC Managed Care – PPO | Source: Ambulatory Visit | Attending: Gynecology | Admitting: Gynecology

## 2012-12-04 ENCOUNTER — Encounter (HOSPITAL_COMMUNITY): Payer: Self-pay

## 2012-12-04 DIAGNOSIS — Z01818 Encounter for other preprocedural examination: Secondary | ICD-10-CM | POA: Insufficient documentation

## 2012-12-04 DIAGNOSIS — Z01812 Encounter for preprocedural laboratory examination: Secondary | ICD-10-CM | POA: Insufficient documentation

## 2012-12-04 LAB — BASIC METABOLIC PANEL
Calcium: 10.5 mg/dL (ref 8.4–10.5)
Chloride: 100 mEq/L (ref 96–112)
Creatinine, Ser: 0.77 mg/dL (ref 0.50–1.10)
GFR calc Af Amer: >90 mL/min (ref >90)
GFR calc non Af Amer: >90 mL/min (ref >90)
Glucose, Bld: 99 mg/dL (ref 70–99)
Potassium: 3.2 mEq/L — ABNORMAL LOW (ref 3.5–5.1)
Sodium: 137 mEq/L (ref 135–145)

## 2012-12-04 LAB — CBC
HCT: 40.2 % (ref 36.0–46.0)
Hemoglobin: 12.9 g/dL (ref 12.0–15.0)
MCH: 26.2 pg (ref 26.0–34.0)
MCHC: 32.1 g/dL (ref 30.0–36.0)
RBC: 4.93 MIL/uL (ref 3.87–5.11)
RDW: 15.1 % (ref 11.5–15.5)
WBC: 5.3 10*3/uL (ref 4.0–10.5)

## 2012-12-04 NOTE — Pre-Procedure Instructions (Signed)
Patient's K+ borderline low- pt instructed to take her K+ on a regular schedule and not skip doses- also pt states she is concentrating on eating more high K+ foods.

## 2012-12-04 NOTE — Patient Instructions (Signed)
Your procedure is scheduled on:12/10/12  Enter through the Main Entrance at :0830 Pick up desk phone and dial [phone] and inform us of your arrival.  Please call [phone] if you have any problems the morning of surgery.  Remember: Do not eat food after midnight:Sunday Clear liquids are ok until:6am Monday   You may brush your teeth the morning of surgery.  Take these meds the morning of surgery with a sip of water:Thyroid med and BP meds  DO NOT wear jewelry, eye make-up, lipstick,body lotion, or dark fingernail polish.  (Polished toes are ok) You may wear deodorant.  If you are to be admitted after surgery, leave suitcase in car until your room has been assigned. Patients discharged on the day of surgery will not be allowed to drive home. Wear loose fitting, comfortable clothes for your ride home.

## 2012-12-06 ENCOUNTER — Other Ambulatory Visit: Payer: Self-pay

## 2012-12-10 ENCOUNTER — Ambulatory Visit (HOSPITAL_COMMUNITY): Payer: BC Managed Care – PPO | Admitting: Anesthesiology

## 2012-12-10 ENCOUNTER — Ambulatory Visit (HOSPITAL_COMMUNITY)
Admission: RE | Admit: 2012-12-10 | Discharge: 2012-12-10 | Disposition: A | Discharge status: Home / Self Care | Payer: BC Managed Care – PPO | Source: Ambulatory Visit | Attending: Gynecology | Admitting: Gynecology

## 2012-12-10 ENCOUNTER — Encounter (HOSPITAL_COMMUNITY): Payer: BC Managed Care – PPO | Admitting: Anesthesiology

## 2012-12-10 ENCOUNTER — Encounter (HOSPITAL_COMMUNITY): Payer: Self-pay | Admitting: *Deleted

## 2012-12-10 ENCOUNTER — Encounter (HOSPITAL_COMMUNITY)
Admission: RE | Disposition: A | Discharge status: Home / Self Care | Payer: Self-pay | Source: Ambulatory Visit | Attending: Gynecology

## 2012-12-10 DIAGNOSIS — Z9889 Other specified postprocedural states: Secondary | ICD-10-CM

## 2012-12-10 DIAGNOSIS — N840 Polyp of corpus uteri: Secondary | ICD-10-CM

## 2012-12-10 DIAGNOSIS — D259 Leiomyoma of uterus, unspecified: Secondary | ICD-10-CM | POA: Insufficient documentation

## 2012-12-10 DIAGNOSIS — I10 Essential (primary) hypertension: Secondary | ICD-10-CM

## 2012-12-10 HISTORY — PX: DILATATION & CURETTAGE/HYSTEROSCOPY WITH TRUECLEAR: SHX6353

## 2012-12-10 LAB — PREGNANCY, URINE: Preg Test, Ur: NEGATIVE

## 2012-12-10 SURGERY — DILATATION & CURETTAGE/HYSTEROSCOPY WITH TRUCLEAR
Anesthesia: Choice

## 2012-12-10 MED ORDER — GLYCOPYRROLATE 0.2 MG/ML IJ SOLN
INTRAMUSCULAR | Status: DC | PRN
  Administered 2012-12-10: 0.1 mg via INTRAVENOUS

## 2012-12-10 MED ORDER — DEXAMETHASONE SODIUM PHOSPHATE 10 MG/ML IJ SOLN
INTRAMUSCULAR | Status: AC
  Filled 2012-12-10: qty 1

## 2012-12-10 MED ORDER — KETOROLAC TROMETHAMINE 30 MG/ML IJ SOLN
INTRAMUSCULAR | Status: AC
  Filled 2012-12-10: qty 1

## 2012-12-10 MED ORDER — LIDOCAINE HCL 2 % IJ SOLN
INTRAMUSCULAR | Status: DC | PRN
  Administered 2012-12-10: 10 mL

## 2012-12-10 MED ORDER — LIDOCAINE HCL (CARDIAC) 20 MG/ML IV SOLN
INTRAVENOUS | Status: AC
  Filled 2012-12-10: qty 5

## 2012-12-10 MED ORDER — FENTANYL CITRATE 0.05 MG/ML IJ SOLN
INTRAMUSCULAR | Status: AC
  Filled 2012-12-10: qty 5

## 2012-12-10 MED ORDER — MIDAZOLAM HCL 2 MG/2ML IJ SOLN
INTRAMUSCULAR | Status: DC | PRN
  Administered 2012-12-10: 1 mg via INTRAVENOUS

## 2012-12-10 MED ORDER — LIDOCAINE HCL (CARDIAC) 20 MG/ML IV SOLN
INTRAVENOUS | Status: DC | PRN
  Administered 2012-12-10: 50 mg via INTRAVENOUS

## 2012-12-10 MED ORDER — SILVER NITRATE-POT NITRATE 75-25 % EX MISC
CUTANEOUS | Status: AC
  Filled 2012-12-10: qty 1

## 2012-12-10 MED ORDER — ONDANSETRON HCL 4 MG/2ML IJ SOLN
INTRAMUSCULAR | Status: DC | PRN
  Administered 2012-12-10: 4 mg via INTRAVENOUS

## 2012-12-10 MED ORDER — FENTANYL CITRATE 0.05 MG/ML IJ SOLN
25.0000 ug | INTRAMUSCULAR | Status: DC | PRN
  Administered 2012-12-10: 0.5 ug via INTRAVENOUS

## 2012-12-10 MED ORDER — ACETAMINOPHEN 160 MG/5ML PO SOLN
1000.0000 mg | Freq: Four times a day (QID) | ORAL | Status: DC | PRN
  Administered 2012-12-10: 1000 mg via ORAL

## 2012-12-10 MED ORDER — MIDAZOLAM HCL 2 MG/2ML IJ SOLN
INTRAMUSCULAR | Status: AC
  Filled 2012-12-10: qty 2

## 2012-12-10 MED ORDER — FENTANYL CITRATE 0.05 MG/ML IJ SOLN
INTRAMUSCULAR | Status: DC | PRN
  Administered 2012-12-10: 50 ug via INTRAVENOUS

## 2012-12-10 MED ORDER — ONDANSETRON HCL 4 MG/2ML IJ SOLN
INTRAMUSCULAR | Status: AC
  Filled 2012-12-10: qty 2

## 2012-12-10 MED ORDER — KETOROLAC TROMETHAMINE 30 MG/ML IJ SOLN
INTRAMUSCULAR | Status: DC | PRN
  Administered 2012-12-10: 30 mg via INTRAVENOUS

## 2012-12-10 MED ORDER — BUPIVACAINE-EPINEPHRINE 0.25% -1:200000 IJ SOLN
INTRAMUSCULAR | Status: DC | PRN
  Administered 2012-12-10: 10 mL

## 2012-12-10 MED ORDER — PROPOFOL 10 MG/ML IV EMUL
INTRAVENOUS | Status: AC
  Filled 2012-12-10: qty 40

## 2012-12-10 MED ORDER — GLYCOPYRROLATE 0.2 MG/ML IJ SOLN
INTRAMUSCULAR | Status: AC
  Filled 2012-12-10: qty 1

## 2012-12-10 MED ORDER — LACTATED RINGERS IV SOLN
INTRAVENOUS | Status: DC
  Administered 2012-12-10 (×2): via INTRAVENOUS

## 2012-12-10 MED ORDER — LIDOCAINE HCL 2 % IJ SOLN
INTRAMUSCULAR | Status: AC
  Filled 2012-12-10: qty 20

## 2012-12-10 MED ORDER — ACETAMINOPHEN 160 MG/5ML PO SOLN
ORAL | Status: AC
  Administered 2012-12-10: 1000 mg via ORAL
  Filled 2012-12-10: qty 40.6

## 2012-12-10 MED ORDER — PROPOFOL 10 MG/ML IV BOLUS
INTRAVENOUS | Status: DC | PRN
  Administered 2012-12-10: 250 mg via INTRAVENOUS

## 2012-12-10 MED ORDER — BUPIVACAINE-EPINEPHRINE PF 0.25-1:200000 % IJ SOLN
INTRAMUSCULAR | Status: AC
  Filled 2012-12-10: qty 30

## 2012-12-10 MED ORDER — FENTANYL CITRATE 0.05 MG/ML IJ SOLN
INTRAMUSCULAR | Status: AC
  Administered 2012-12-10: 0.5 ug via INTRAVENOUS
  Filled 2012-12-10: qty 2

## 2012-12-10 MED ORDER — DEXAMETHASONE SODIUM PHOSPHATE 4 MG/ML IJ SOLN
INTRAMUSCULAR | Status: DC | PRN
  Administered 2012-12-10: 10 mg via INTRAVENOUS

## 2012-12-10 MED ORDER — LACTATED RINGERS IV SOLN: INTRAVENOUS | Status: DC

## 2012-12-10 SURGICAL SUPPLY — 20 items
BLADE INCISOR TRUC PLUS 2.9 (ABLATOR) IMPLANT
CANISTERS HI-FLOW 3000CC (CANNISTER) ×4 IMPLANT
CATH ROBINSON RED A/P 16FR (CATHETERS) ×2 IMPLANT
CONTAINER PREFILL 10% NBF 60ML (FORM) ×3 IMPLANT
DRAPE HYSTEROSCOPY (DRAPE) IMPLANT
DRESSING TELFA 8X3 (GAUZE/BANDAGES/DRESSINGS) ×2 IMPLANT
GLOVE BIOGEL M 6.5 STRL (GLOVE) ×2 IMPLANT
GLOVE BIOGEL PI IND STRL 6.5 (GLOVE) ×1 IMPLANT
GLOVE BIOGEL PI INDICATOR 6.5 (GLOVE) ×1
GOWN STRL REIN XL XLG (GOWN DISPOSABLE) ×4 IMPLANT
INCISOR TRUC PLUS BLADE 2.9 (ABLATOR) ×2
KIT HYSTEROSCOPY TRUCLEAR (ABLATOR) IMPLANT
MORCELLATOR RECIP TRUCLEAR 4.0 (ABLATOR) IMPLANT
NDL SPNL 22GX3.5 QUINCKE BK (NEEDLE) ×1 IMPLANT
NEEDLE SPNL 22GX3.5 QUINCKE BK (NEEDLE) ×2 IMPLANT
PACK VAGINAL MINOR WOMEN LF (CUSTOM PROCEDURE TRAY) ×2 IMPLANT
PAD OB MATERNITY 4.3X12.25 (PERSONAL CARE ITEMS) ×2 IMPLANT
SYRINGE 10CC LL (SYRINGE) ×2 IMPLANT
TOWEL OR 17X24 6PK STRL BLUE (TOWEL DISPOSABLE) ×4 IMPLANT
WATER STERILE IRR 1000ML POUR (IV SOLUTION) ×2 IMPLANT

## 2012-12-10 NOTE — Anesthesia Postprocedure Evaluation (Signed)
  Anesthesia Post-op Note  Patient: Alicia Huff  Procedure(s) Performed: Procedure(s): DILATATION & CURETTAGE/HYSTEROSCOPY WITH TRUCLEAR (N/A)  Patient Location: PACU  Anesthesia Type:General  Level of Consciousness: awake, alert  and oriented  Airway and Oxygen Therapy: Patient Spontanous Breathing  Post-op Pain: none  Post-op Assessment: Post-op Vital signs reviewed, Patient's Cardiovascular Status Stable, Respiratory Function Stable, Patent Airway, No signs of Nausea or vomiting and Pain level controlled  Post-op Vital Signs: Reviewed and stable  Complications: No apparent anesthesia complications

## 2012-12-10 NOTE — Transfer of Care (Signed)
Immediate Anesthesia Transfer of Care Note  Patient: Alicia Huff  Procedure(s) Performed: Procedure(s): DILATATION & CURETTAGE/HYSTEROSCOPY WITH TRUCLEAR (N/A)  Patient Location: PACU  Anesthesia Type:General  Level of Consciousness: awake, alert , oriented and patient cooperative  Airway & Oxygen Therapy: Patient Spontanous Breathing and Patient connected to nasal cannula oxygen  Post-op Assessment: Report given to PACU RN and Post -op Vital signs reviewed and stable  Post vital signs: Reviewed and stable  Complications: No apparent anesthesia complications

## 2012-12-10 NOTE — Anesthesia Preprocedure Evaluation (Addendum)
Anesthesia Evaluation  Patient identified by MRN, date of birth, ID band Patient awake    Reviewed: Allergy & Precautions, H&P , Patient's Chart, lab work & pertinent test results, reviewed documented beta blocker date and time   Airway Mallampati: III TM Distance: >3 FB Neck ROM: full    Dental no notable dental hx.    Pulmonary sleep apnea and Continuous Positive Airway Pressure Ventilation ,  breath sounds clear to auscultation  Pulmonary exam normal       Cardiovascular hypertension, On Medications Rhythm:regular Rate:Normal     Neuro/Psych    GI/Hepatic   Endo/Other  Hypothyroidism Morbid obesity  Renal/GU      Musculoskeletal   Abdominal   Peds  Hematology   Anesthesia Other Findings   Reproductive/Obstetrics                           Anesthesia Physical Anesthesia Plan  ASA: III  Anesthesia Plan:    Post-op Pain Management:    Induction: Intravenous  Airway Management Planned: LMA  Additional Equipment:   Intra-op Plan:   Post-operative Plan:   Informed Consent: I have reviewed the patients History and Physical, chart, labs and discussed the procedure including the risks, benefits and alternatives for the proposed anesthesia with the patient or authorized representative who has indicated his/her understanding and acceptance.   Dental Advisory Given and Dental advisory given  Plan Discussed with: CRNA and Surgeon  Anesthesia Plan Comments:         Anesthesia Quick Evaluation

## 2012-12-10 NOTE — Brief Op Note (Signed)
12/10/2012  10:57 AM  PATIENT:  Blima Dessert  47 y.o. female  PRE-OPERATIVE DIAGNOSIS:  endometrial mass  POST-OPERATIVE DIAGNOSIS:  endometrial mass  PROCEDURE:  Procedure(s): DILATATION & CURETTAGE/HYSTEROSCOPY WITH TRUCLEAR (N/A)  SURGEON:  Surgeon(s) and Role:    * Bennye Alm, MD - Primary  PHYSICIAN ASSISTANT:   ASSISTANTS: none   ANESTHESIA:   general and paracervical block  EBL:  Total I/O In: 1000 [I.V.:1000] Out: 300 [Urine:300]  BLOOD ADMINISTERED:none  DRAINS: none   LOCAL MEDICATIONS USED: 0.25% MARCAINE   , 2% LIDOCAINE  and Amount: 10cc each  SPECIMEN:  Source of Specimen:  uterus  DISPOSITION OF SPECIMEN:  PATHOLOGY  COUNTS:  YES  TOURNIQUET:  * No tourniquets in log *  DICTATION: .Other Dictation: Dictation Number [id number]  PLAN OF CARE: Discharge to home after PACU  PATIENT DISPOSITION:  PACU - hemodynamically stable.   Delay start of Pharmacological VTE agent (>24hrs) due to surgical blood loss or risk of bleeding: not applicable

## 2012-12-10 NOTE — H&P (View-Only) (Signed)
  47 y.o.MarriedNot Hispanic or Latinofemale presents for preoperative consult for [facility] hysteroscopy with tru-clear resectoscope for endometrial mass.   Pertinent items are noted in HPI.  BP 126/84  Pulse 76  Resp 18  Ht 5\' 7"  (1.702 m)  Wt 241 lb (109.317 kg)  BMI 37.74 kg/m2 General appearance: alert, cooperative and appears stated age Lungs: clear to auscultation bilaterally Heart: regular rate and rhythm, S1, S2 normal, no murmur, click, rub or gallop Abdomen: soft, non-tender; bowel sounds normal; no masses,  no organomegaly Neurologic: Grossly normal , alert and oriented x3  The procedure was discussed at length, she previewed a few you tube videos and felt very comfortable with the procedure.  Pt informed regarding risks and benefits of surgery, including but not limited to bleeding, infections, damage to bowel or bladder due to uterine perforation either during the procedure or during dilation.  A medicationwill be be given preoperatively to soften the cervix.    There is a risk of formation of a deep vein thrombus in the extremities was discussed, although PAS will be placed to minimize the risk, the patient was informed that a pulmonary embolism could still form and could result in death.  She was instructed on signs and symptoms to be aware of and the need to call if they should develop.  Fluid overload from the distending media was discussed as was the usual intra-operative safety precautions in place.  All questions were addressed.

## 2012-12-10 NOTE — Interval H&P Note (Signed)
History and Physical Interval Note:  12/10/2012 9:55 AM  Alicia Huff  has presented today for surgery, with the diagnosis of endometrial mass  The various methods of treatment have been discussed with the patient and family. After consideration of risks, benefits and other options for treatment, the patient has consented to  Procedure(s): DILATATION & CURETTAGE/HYSTEROSCOPY WITH TRUCLEAR (N/A) as a surgical intervention .  The patient's history has been reviewed, patient examined, no change in status, stable for surgery.  I have reviewed the patient's chart and labs.  Questions were answered to the patient's satisfaction.     Kaile Bixler H

## 2012-12-11 ENCOUNTER — Encounter (HOSPITAL_COMMUNITY): Payer: Self-pay | Admitting: Gynecology

## 2012-12-11 NOTE — Op Note (Signed)
NAME:  Alicia Huff, Alicia Huff    ACCOUNT NO.:  [id number]  MEDICAL RECORD NO.:  [id number]  LOCATION:  [facility]                          [facility]ACILITY:  [facility]  PHYSICIAN:  Ivor Costa. Farrel Gobble, M.D. DATE OF BIRTH:  10-01-1965  DATE OF PROCEDURE:  12/10/2012 DATE OF DISCHARGE:  12/10/2012                              OPERATIVE REPORT   PREOPERATIVE DIAGNOSIS:  Endometrial mass, questionable polyp.  POSTOPERATIVE DIAGNOSIS:  Endometrial polyp.  SURGEON:  Ivor Costa. Farrel Gobble, MD  ANESTHESIA:  General with a paracervical block of equal parts 0.25% Marcaine, 2% lidocaine with epi for a total of 10 mL each.  IV FLUIDS:  1000 mL of lactated Ringer's.  URINE OUTPUT:  300 mL.  I and O deficit of normal saline solution was approximately 50 mL.  FINDINGS:  Mildly enlarged uterus that sounded to 9, smooth uterine contours, normal ostia, posterior wall mass consistent with an endometrial polyp.  COMPLICATIONS:  None.  PATHOLOGY:  Endometrial curettings and endometrial polyp.  DESCRIPTION OF PROCEDURE:  The patient was taken to the operating room. General anesthesia was induced and placed in dorsal lithotomy position, prepped and draped in usual sterile fashion.  A bimanual exam was performed.  Orientation of the uterus was confirmed.  Sterile weighted speculum was then placed in the vagina.  The cervix was visualized, stabilized with a single-tooth tenaculum.  Paracervical block as mentioned above was placed circumferentially around the cervix.  The uterus was then sounded to 9.  The cervix was dilated to 19-French after which, the TRUCLEAR resectoscope was advanced through the cervix and into the cavity.  The findings were as mentioned above upon fluid retrieval from the cavity, a posterior wall polyp was noted. Endometrial curettings were obtained.  The TRUCLEAR was then prepped appropriately.  The scope was then readvanced back through the cervix.  The TRUCLEAR  was applied to the endometrial  polyp which was then removed in its entirety.  The cavity was otherwise unremarkable. The patient tolerated the procedure well.  Sponge, lap, and needle counts correct x2.  She was extubated in the OR, and transferred to the recovery room in stable condition.     Ivor Costa. Farrel Gobble, M.D.     THL/MEDQ  D:  12/10/2012  T:  12/11/2012  Job:  [id number]

## 2012-12-19 ENCOUNTER — Ambulatory Visit (INDEPENDENT_AMBULATORY_CARE_PROVIDER_SITE_OTHER): Payer: BC Managed Care – PPO | Admitting: Gynecology

## 2012-12-19 ENCOUNTER — Encounter: Payer: Self-pay | Admitting: Gynecology

## 2012-12-19 VITALS — BP 120/88 | HR 68 | Resp 14 | Ht 67.0 in | Wt 238.0 lb

## 2012-12-19 DIAGNOSIS — M545 Low back pain: Secondary | ICD-10-CM

## 2012-12-19 DIAGNOSIS — N840 Polyp of corpus uteri: Secondary | ICD-10-CM

## 2012-12-19 DIAGNOSIS — D259 Leiomyoma of uterus, unspecified: Secondary | ICD-10-CM

## 2012-12-19 NOTE — Progress Notes (Signed)
Subjective:     Patient ID: Alicia Huff, female   DOB: 23-May-1965, 47 y.o.   MRN: [id number]  HPI Comments: Pt here 10d post-op after hysteroscopy for polyp.  Pt is without complains.  Minimal bleeding and pain afterwards.  No sex since surgery.  Pt without complaints. Pt has been seeing PT for pain.    Review of Systems  Genitourinary: Negative for vaginal bleeding, vaginal discharge, vaginal pain and pelvic pain.       Objective:   Physical Exam  Constitutional: She is oriented to person, place, and time. She appears well-developed and well-nourished.  Genitourinary: Vagina normal and uterus normal. There is no rash or tenderness on the right labia. There is no rash or tenderness on the left labia. Cervix exhibits no motion tenderness and no discharge. Right adnexum displays no mass. Left adnexum displays no mass.  Neurological: She is alert and oriented to person, place, and time.       Assessment:     Post-op doing well Pathology reviewed, benign     Plan:     Resume usual activities F/u with PT/ spine

## 2012-12-25 ENCOUNTER — Other Ambulatory Visit: Payer: Self-pay | Admitting: General Practice

## 2012-12-25 DIAGNOSIS — I10 Essential (primary) hypertension: Secondary | ICD-10-CM

## 2012-12-25 MED ORDER — AMLODIPINE BESYLATE 5 MG PO TABS: 5.0000 mg | ORAL_TABLET | Freq: Every day | ORAL | Status: DC

## 2012-12-25 MED ORDER — POTASSIUM CHLORIDE CRYS ER 20 MEQ PO TBCR: 20.0000 meq | EXTENDED_RELEASE_TABLET | Freq: Every day | ORAL | Status: DC

## 2012-12-25 MED ORDER — LEVOTHYROXINE SODIUM 50 MCG PO TABS: 50.0000 ug | ORAL_TABLET | Freq: Every day | ORAL | Status: DC

## 2012-12-25 MED ORDER — ATORVASTATIN CALCIUM 20 MG PO TABS: 20.0000 mg | ORAL_TABLET | Freq: Every day | ORAL | Status: DC

## 2012-12-25 MED ORDER — OLMESARTAN MEDOXOMIL-HCTZ 40-12.5 MG PO TABS: 1.0000 | ORAL_TABLET | Freq: Every day | ORAL | Status: DC

## 2012-12-25 NOTE — Telephone Encounter (Signed)
meds filled #[facility]. Letter mailed to pt to schedule complete physical.

## 2013-01-04 ENCOUNTER — Telehealth: Payer: Self-pay | Admitting: Family Medicine

## 2013-01-04 DIAGNOSIS — I10 Essential (primary) hypertension: Secondary | ICD-10-CM

## 2013-01-04 MED ORDER — LEVOTHYROXINE SODIUM 50 MCG PO TABS: 50.0000 ug | ORAL_TABLET | Freq: Every day | ORAL | Status: DC

## 2013-01-04 MED ORDER — POTASSIUM CHLORIDE CRYS ER 20 MEQ PO TBCR: 20.0000 meq | EXTENDED_RELEASE_TABLET | Freq: Every day | ORAL | Status: DC

## 2013-01-04 MED ORDER — AMLODIPINE BESYLATE 5 MG PO TABS: 5.0000 mg | ORAL_TABLET | Freq: Every day | ORAL | Status: DC

## 2013-01-04 NOTE — Telephone Encounter (Signed)
Meds filled

## 2013-01-04 NOTE — Telephone Encounter (Signed)
Patient states that [facility] called her and told her that 3 of of rx's were going to be delayed. They instructed her to call our office and request a 7-day prescription with 1 refill to be sent to her local pharmacy and they will pay for it. She only has a 72 hour window in order to have them cover her medications. She needs a 7-day rx with 1 refill sent to [facility] [address] for the 3 medications below:  amLODipine (NORVASC) 5 MG tablet potassium chloride SA (K-DUR,KLOR-CON) 20 MEQ tablet  levothyroxine (SYNTHROID, LEVOTHROID) 50 MCG tablet

## 2013-02-06 ENCOUNTER — Ambulatory Visit: Payer: BC Managed Care – PPO | Admitting: Neurology

## 2013-03-25 ENCOUNTER — Telehealth: Payer: Self-pay | Admitting: Gynecology

## 2013-03-25 ENCOUNTER — Encounter: Payer: Self-pay | Admitting: Family Medicine

## 2013-03-25 NOTE — Telephone Encounter (Signed)
Returning a call to Jessica °

## 2013-05-13 ENCOUNTER — Encounter: Payer: Self-pay | Admitting: Family Medicine

## 2013-05-13 ENCOUNTER — Ambulatory Visit (INDEPENDENT_AMBULATORY_CARE_PROVIDER_SITE_OTHER): Payer: No Typology Code available for payment source | Admitting: Family Medicine

## 2013-05-13 VITALS — BP 118/82 | HR 80 | Temp 98.2°F | Resp 16 | Wt 241.4 lb

## 2013-05-13 DIAGNOSIS — E039 Hypothyroidism, unspecified: Secondary | ICD-10-CM

## 2013-05-13 DIAGNOSIS — I10 Essential (primary) hypertension: Secondary | ICD-10-CM

## 2013-05-13 DIAGNOSIS — E785 Hyperlipidemia, unspecified: Secondary | ICD-10-CM

## 2013-05-13 LAB — CBC WITH DIFFERENTIAL/PLATELET
Basophils Absolute: 0 10*3/uL (ref 0.0–0.1)
Basophils Relative: 0.5 % (ref 0.0–3.0)
Eosinophils Absolute: 0.1 10*3/uL (ref 0.0–0.7)
Eosinophils Relative: 1.7 % (ref 0.0–5.0)
HEMATOCRIT: 39.7 % (ref 36.0–46.0)
HEMOGLOBIN: 12.5 g/dL (ref 12.0–15.0)
LYMPHS ABS: 2.1 10*3/uL (ref 0.7–4.0)
Lymphocytes Relative: 34.3 % (ref 12.0–46.0)
MCHC: 31.5 g/dL (ref 30.0–36.0)
MCV: 82.6 fl (ref 78.0–100.0)
MONOS PCT: 7.4 % (ref 3.0–12.0)
Monocytes Absolute: 0.5 10*3/uL (ref 0.1–1.0)
NEUTROS ABS: 3.5 10*3/uL (ref 1.4–7.7)
Neutrophils Relative %: 56.1 % (ref 43.0–77.0)
Platelets: 223 10*3/uL (ref 150.0–400.0)
RBC: 4.8 Mil/uL (ref 3.87–5.11)
RDW: 15.4 % — ABNORMAL HIGH (ref 11.5–14.6)
WBC: 6.3 10*3/uL (ref 4.5–10.5)

## 2013-05-13 LAB — LIPID PANEL
Cholesterol: 222 mg/dL — ABNORMAL HIGH (ref 0–200)
HDL: 67.9 mg/dL (ref >39.00)
LDL CALC: 144 mg/dL — AB (ref 0–99)
Total CHOL/HDL Ratio: 3
Triglycerides: 51 mg/dL (ref 0.0–149.0)
VLDL: 10.2 mg/dL (ref 0.0–40.0)

## 2013-05-13 LAB — BASIC METABOLIC PANEL
BUN: 11 mg/dL (ref 6–23)
CO2: 27 mEq/L (ref 19–32)
CREATININE: 0.7 mg/dL (ref 0.4–1.2)
Calcium: 10.3 mg/dL (ref 8.4–10.5)
Chloride: 102 mEq/L (ref 96–112)
GFR: 117.86 mL/min (ref >60.00)
Glucose, Bld: 95 mg/dL (ref 70–99)
Potassium: 3.2 mEq/L — ABNORMAL LOW (ref 3.5–5.1)
Sodium: 139 mEq/L (ref 135–145)

## 2013-05-13 LAB — HEPATIC FUNCTION PANEL
ALBUMIN: 3.7 g/dL (ref 3.5–5.2)
ALT: 14 U/L (ref 0–35)
AST: 20 U/L (ref 0–37)
Alkaline Phosphatase: 74 U/L (ref 39–117)
Bilirubin, Direct: 0 mg/dL (ref 0.0–0.3)
Total Bilirubin: 0.7 mg/dL (ref 0.3–1.2)
Total Protein: 8 g/dL (ref 6.0–8.3)

## 2013-05-13 LAB — TSH: TSH: 2.61 u[IU]/mL (ref 0.35–5.50)

## 2013-05-13 MED ORDER — LEVOTHYROXINE SODIUM 50 MCG PO TABS: 50.0000 ug | ORAL_TABLET | Freq: Every day | ORAL | Status: DC

## 2013-05-13 MED ORDER — POTASSIUM CHLORIDE CRYS ER 20 MEQ PO TBCR: 20.0000 meq | EXTENDED_RELEASE_TABLET | Freq: Every day | ORAL | Status: DC

## 2013-05-13 MED ORDER — ATORVASTATIN CALCIUM 20 MG PO TABS: 20.0000 mg | ORAL_TABLET | Freq: Every day | ORAL | Status: AC

## 2013-05-13 MED ORDER — OLMESARTAN MEDOXOMIL-HCTZ 40-12.5 MG PO TABS: 1.0000 | ORAL_TABLET | Freq: Every day | ORAL | Status: DC

## 2013-05-13 NOTE — Progress Notes (Signed)
   Subjective:    Patient ID: Alicia Huff, female    DOB: 10-13-1965, 48 y.o.   MRN: [id number]  HPI HTN- chronic problem, well controlled today.  On Amlodipine, Benicar HCT.  Pt is now walking on treadmill regularly.  Denies CP, SOB, HAs, visual changes, edema.  Hyperlipidemia- chronic problem, on Lipitor.  Intermittent abd discomfort.  Increased salads, fruit, fiber, beet juice.  Hypothyroid- chronic problem, on synthroid.  Still w/ fatigue, no constipation.  Hair and nails are improving.   Review of Systems For ROS see HPI     Objective:   Physical Exam  Vitals reviewed. Constitutional: She is oriented to person, place, and time. She appears well-developed and well-nourished. No distress.  HENT:  Head: Normocephalic and atraumatic.  Eyes: Conjunctivae and EOM are normal. Pupils are equal, round, and reactive to light.  Neck: Normal range of motion. Neck supple. No thyromegaly present.  Cardiovascular: Normal rate, regular rhythm, normal heart sounds and intact distal pulses.   No murmur heard. Pulmonary/Chest: Effort normal and breath sounds normal. No respiratory distress.  Abdominal: Soft. She exhibits no distension. There is no tenderness.  Musculoskeletal: She exhibits no edema.  Lymphadenopathy:    She has no cervical adenopathy.  Neurological: She is alert and oriented to person, place, and time.  Skin: Skin is warm and dry.  Psychiatric: She has a normal mood and affect. Her behavior is normal.          Assessment & Plan:

## 2013-05-13 NOTE — Assessment & Plan Note (Signed)
Excellent control today.  Will attempt to stop Norvasc and monitor BP.  Check labs.  Adjust meds prn.

## 2013-05-13 NOTE — Assessment & Plan Note (Signed)
Chronic problem.  Pt continues to have fatigue but no other symptoms.  Check labs.  Adjust meds prn

## 2013-05-13 NOTE — Patient Instructions (Signed)
Schedule your complete physical in 4-6 months We'll notify you of your lab results and make any changes if needed Keep up the good work!  You look great! Call with any questions or concerns Happy Spring!!!

## 2013-05-13 NOTE — Assessment & Plan Note (Signed)
Chronic problem, tolerating statin.  Check labs.  Adjust meds prn

## 2013-05-13 NOTE — Progress Notes (Signed)
Pre visit review using our clinic review tool, if applicable. No additional management support is needed unless otherwise documented below in the visit note. 

## 2013-05-14 ENCOUNTER — Encounter: Payer: Self-pay | Admitting: General Practice

## 2013-05-27 LAB — HM MAMMOGRAPHY

## 2013-06-04 ENCOUNTER — Encounter: Payer: Self-pay | Admitting: General Practice

## 2013-07-22 ENCOUNTER — Encounter: Payer: Self-pay | Admitting: Family Medicine

## 2013-09-23 ENCOUNTER — Encounter: Payer: No Typology Code available for payment source | Admitting: Family Medicine

## 2013-09-24 ENCOUNTER — Encounter: Payer: Self-pay | Admitting: Gynecology

## 2013-10-04 ENCOUNTER — Encounter: Payer: No Typology Code available for payment source | Admitting: Family Medicine

## 2013-10-16 ENCOUNTER — Ambulatory Visit: Payer: No Typology Code available for payment source | Admitting: Family Medicine

## 2013-10-18 ENCOUNTER — Ambulatory Visit: Payer: BC Managed Care – PPO | Admitting: Gynecology

## 2013-10-25 ENCOUNTER — Encounter: Payer: Self-pay | Admitting: Family Medicine

## 2013-10-29 ENCOUNTER — Ambulatory Visit (INDEPENDENT_AMBULATORY_CARE_PROVIDER_SITE_OTHER): Payer: No Typology Code available for payment source | Admitting: Neurology

## 2013-10-29 ENCOUNTER — Encounter: Payer: Self-pay | Admitting: Neurology

## 2013-10-29 VITALS — BP 152/116 | HR 76 | Resp 15 | Ht 67.5 in | Wt 241.0 lb

## 2013-10-29 DIAGNOSIS — G4710 Hypersomnia, unspecified: Secondary | ICD-10-CM

## 2013-10-29 DIAGNOSIS — R0609 Other forms of dyspnea: Secondary | ICD-10-CM

## 2013-10-29 DIAGNOSIS — R0683 Snoring: Secondary | ICD-10-CM

## 2013-10-29 DIAGNOSIS — R6889 Other general symptoms and signs: Secondary | ICD-10-CM

## 2013-10-29 DIAGNOSIS — Z9989 Dependence on other enabling machines and devices: Secondary | ICD-10-CM

## 2013-10-29 DIAGNOSIS — IMO0002 Reserved for concepts with insufficient information to code with codable children: Secondary | ICD-10-CM

## 2013-10-29 DIAGNOSIS — E669 Obesity, unspecified: Secondary | ICD-10-CM

## 2013-10-29 DIAGNOSIS — R0989 Other specified symptoms and signs involving the circulatory and respiratory systems: Secondary | ICD-10-CM

## 2013-10-29 DIAGNOSIS — G47419 Narcolepsy without cataplexy: Secondary | ICD-10-CM

## 2013-10-29 DIAGNOSIS — G894 Chronic pain syndrome: Secondary | ICD-10-CM

## 2013-10-29 DIAGNOSIS — G4733 Obstructive sleep apnea (adult) (pediatric): Secondary | ICD-10-CM

## 2013-10-29 MED ORDER — ARMODAFINIL 150 MG PO TABS: 150.0000 mg | ORAL_TABLET | Freq: Every day | ORAL | Status: DC

## 2013-10-29 NOTE — Progress Notes (Addendum)
Chief Complaint  Patient presents with  . Follow-up    CPAP Compliance   This patient [name] is seen here today are 10-29-13.  she's a nonsmoker nondrinker and sleep complaints include apnea daytime sleepiness and snoring she was titrated to CPAP in a SPLIT study performed at [facility]  long hospital study and has been here today seen for a CPAP compliance visit. For sleepiness score at 15 points and the fatigue severity score at 51 points, pulse questionnaires are of very a high. Her compliance report shows that she uses machine faithfully over the last 30 days 100% compliance is noted. Residual AHI is 1.1, the hours of usage of 7 hours and 46 minutes daily. Again 100% compliance. In spite of this the patient continues to be excessively daytime sleepy and fatigued. The patient has  HTN, a mitral  valve prolapse ,no a fib and no CAD.  The patient assured me she doesn't drive other than in a 10 mile radius. She is driven by husband today. She doesn;t work. Back and gait related problems deferred to her back specialist.  She has vivid dreams.   she would be happy to try Nuvigil.   She has noted her legs buckle when she gets angry, sometimes her speech changes. Cataplexy ?  CD      Last Visit / sleep consult :  [name], right-handed female patient was referred for a sleep study by Dr. Tomma Huff,  after describing headaches in the morning , snoring and EDS, and is now followed by Dr. Metta Huff.   Headaches would be beginning in the morning, could last all day and she had some dissociative episodes ( possible dream intrusions) . She is known to be a loud snorer.  She was also excessively daytime sleepy and endorsed an Epworth sleepiness score of 17 points at the sleep lab visit, than in the office at 14 points, and today at 17 points again.  FSS over 40 .  She feels better than before PAP therapy. She mainly complains of fatigue. Chest pain, non cardiac, palpitations.  Insomnia. Her gynecologist , Alicia Huff ,  Huff a murmur and she will see Alicia Huff on October 7.th for a Visit. The chest pain and fatigue may be related/  Patient is a veteran and is to used to shiftwork and basically has not regained  her sleep habits from that time on . She goes to bed rather early 7:30 PM and is up at 3:30 she awakened spontaneously does not need an alarm.  Even on days when she may go to bed as late as midnight she will still wake up at 3:30.  Average nocturnal sleep time is estimated to be within 6 hours, her sleep was fragmented due to to residual pain after a spinal fusion surgery. Pain is localized to the left arm and leg.  Her family has also been concerned about staring spells her daughter had seen about 3 and her husband has visit with her but just one or 2. The patient seems to be staring disconnected to the surrounding ongoing activity appears glazed or impulse is not responding to verbal stimuli. On one occasion this occurred by she was driving on the highway. According to her daughter she looked very does oriented. Her husband was able to switch place as was her and continue the drive. The patient herself seems to be amnestic for these episodes. These have been infrequently occurring since 2006. Seemed to correlate with stress  and sleep deprivation.  There's a family history of a sister that used to sleepwalk And likely still does. She herself was not aware of any history of sleep walking and she was not aware of any parental history of a sleep disorder her father used to snore.    There she had never undergone a tonsillectomy or adenoids surgery. Dr. Tomma Huff indicated that there was no labored respiration at the time of his visit, her nasal septum was not deviated but the patient has mild micrognathia.  Meanwhile , Alicia Huff had undergone a sleep study 08-13-12 at [facility] , interpreted by Dr. Gwenette Huff. Sleep study revealed 96% sleep efficiency an AHI of 32.3 and  an RDI of 44.8. She was titrated first of CPAP but seemed to have responded to this very is central apneas. She was finally titrated to 10/6 cm water and then 11/6 cm water with an AHI of 0 a sleep efficiency of 87% the total sleep time of only 10 minutes of which moles was REM sleep was observed. I was also today reviewing an old of office download to advanced on care, for 30 days of initial use of CPAP or BiPAP and her case was issue at 09/22/2012 first 30 days shortly 97% he was 90% of these days are over 4 hours.  Average therapy time per day as 5 hours and 18 minutes and her residual AHI is 0.8. The patient is an older set machine right now with a maximum pressure of 11 and a minimum pressure of 4 cm water and in EPR of 3.  The 95th percentile pressure is 7.6 cm H20, - the machine could  be set at 8 (which is the closest pressure available),  but if her insurance allows her to stay on auto- set machine, I would be in favor of continuing to use as.  Her husband has noticed that she snores less or not at all, and she feels better rested in the morning, she has a left dry mouth in the morning. Last night she still had 5 bathroom breaks but this is not rule, in general it is to bathroom breaks. Prior to using the machine she had 25 bathroom breaks. Again today's Epworth 17 points and theFS S.  score 54 points. She will undergo a cardiology evaluation next week established which complement of fatigue could be cardiac related given the discovery of [place]. Given that her baseline obstructive sleep apnea was moderate to severe would like the patient to continue the therapy at the current settings and was at times must but seems to do well and produces not a high air leak. She uses a nasal mask, not pillows. Phillips respironics model.     Physical exam:  General: The patient is awake, alert and appears not in acute distress. The patient is well groomed.  Head: Normocephalic, atraumatic. Neck is supple.  Mallampati 4 , neck circumference: 14  Inches , no nasal deviation, no TMJ ,  Cardiovascular: Regular rate and rhythm, without  carotid bruit, and without distended neck veins.   Murmur is not a click( not audible to me) there are some skipped beats ,  Split S2 sound.  Respiratory: Lungs are clear to auscultation.  Skin: Without evidence of edema, or rash  Trunk: BMI is 35, elevated. Neurologic exam :  The patient is awake and alert, oriented to place and time. Memory subjective described as intact. There is a normal attention span & concentration ability.  Speech is fluent without  dysarthria, dysphonia or aphasia. Mood and affect are appropriate. She is worried about the cardiac murmur.  Cranial nerves:  Pupils are equal and briskly reactive to light. Funduscopic exam without evidence of pallor or edema  . Extraocular movements in vertical and horizontal planes intact and without nystagmus. Visual fields by finger perimetry are intact.  Hearing to finger rub intact. Facial sensation intact to fine touch, tongue and uvula move midline.  Motor exam: Normal tone ,normal muscle bulk and symmetric normal strength in all extremities. No cogwheeling .  Sensory: Fine touch, pinprick and vibration were normal .  She has a dysesthesia of burning , heat in the right leg ( L4-5 dermatome), on the left side there is straight leg Huff induce hamstring pain and numbness with a deeper ache radiating to the lateral foot, L5-S1 . Proprioception in the upper extremities is normal.  Coordination: Rapid alternating movements in the fingers/hands is tested and normal. Finger-to-nose maneuver tested and normal without evidence of ataxia, dysmetria or tremor.  Gait and station: Patient walks without assistive device . Strength within normal limits. Stance is stable and normal. Tandem gait is unfragmented. Romberg testing is ormal.  Deep tendon reflexes: in the upper and lower extremities are attenuated - but symmetric  and intact. Babinski maneuver downgoing.   Assessment:   1) Obstructive sleep apnea, confirmed in a hospital based testing from 08-14-12, the patient reached an AHI of 32.3, a REM AHI of 66.  A nasal Eason mask was used.  Continues to use CPAP -and continues to be sleepier.  chest pain treated by Alicia Huff, [facility] center   . Plan: Treatment plan and additional workup :  Auto PAP from 4-11 cm water with 3 cm flex.  Compliant  100% over  30 days. Nasal mask.  Patient responded well- 2015 with a residual AHI of only 1.1 , 100 %.  No airleaks.

## 2013-10-29 NOTE — Patient Instructions (Signed)
Narcolepsy Narcolepsy is a nervous system disorder that causes daytime sleepiness and sudden bouts of irresistible sleep during the day (sleep attacks). Many people with narcolepsy live with extreme daytime sleepiness for many years before being diagnosed and treated. Narcolepsy is a lifelong (chronic) disorder.  You normally go through cycles when you sleep. When your sleep becomes deeper, you have less body movement, and you start dreaming. This type of deep sleep should happen after about 90 minutes of lighter sleep. Deep sleep is called rapid eye movement (REM) sleep. When you have narcolepsy, REM is not well regulated. It can happen as soon as you fall asleep, and components of REM sleep can occur during the day when you are awake. Uncontrolled REM sleep causes symptoms of narcolepsy. CAUSES Narcolepsy may be caused by an abnormality with a chemical messenger (neurotransmitter) in your brain that controls your sleep and wake cycles. Most people with narcolepsy have low levels of the neurotransmitter hypocretin. Hypocretin is important for controlling wakefulness.  A hypocretin imbalance may be caused by abnormal genes that are passed down through families. It may also develop if the body's defense system (immune system) mistakenly attacks the brain cells that produce hypocretin (autoimmune disease). RISK FACTORS You may be at higher risk for narcolepsy if you have a family history of the disease. Other risk factors that may contribute include:  Injuries, tumors, or infections in the areas of the brain that control sleep.  Exposure to toxins.  Stress.  Hormones produced during puberty and menopause.  Poor sleep habits. SIGNS AND SYMPTOMS  Symptoms of narcolepsy can start at any age but usually begin when people are between the ages of 7 and 25 years. There are four major symptoms. Not everyone with narcolepsy will have all four.   Excessive daytime sleepiness. This is the most common symptom  and is usually the first symptom you will notice.  You may feel as if you are in a mental fog.  Daytime sleepiness may severely affect your performance at work or school.  You may fall asleep during a conversation or while eating dinner.  Sudden loss of muscle tone (cataplexy). You do not lose consciousness, but you may suddenly lose muscle control. When this occurs, your speech may become slurred, or your knees may buckle. This symptom is usually triggered by surprise, anger, or laughter.  Sleep paralysis. You may lose the ability to speak or move just as you start to fall asleep or wake up. You will be aware of the paralysis. It usually lasts for just a few seconds or minutes.  Vivid hallucinations. These may occur with sleep paralysis. The hallucinations are like having bizarre or frightening dreams while you are still awake. Other symptoms may include:   Trouble staying asleep at night (insomnia).  Restless sleep.  Feeling a strong urge to get up at night to smoke or eat. DIAGNOSIS  Your health care provider can diagnose narcolepsy based on your symptoms and the results of two diagnostic tests. You may also be asked to keep a sleep diary for several weeks. You may need the tests if you have had daytime sleepiness for at least 3 months. The two tests are:   A polysomnogram to find out how well your REM sleep is regulated at night. This test is an overnight sleep study. It measures your heart rate, breathing, movement, and brain waves.  A multiple sleep latency test (MSLT) to find out how well your REM sleep is regulated during the day. This   is a daytime sleep study. You may need to take several naps during the day. This test also measures your heart rate, breathing, movement, and brain waves. TREATMENT  There is no cure for narcolepsy, but treatment can be very effective in helping manage the condition. Treatment may include:  Lifestyle and sleeping strategies to help cope with the  condition.  Medicines. These may include:  Stimulant medicines to fight daytime sleepiness.  Antidepressant medicines to treat cataplexy.  Sodium oxybate. This is a strong sedative that you take at night. It can help daytime sleepiness and cataplexy. HOME CARE INSTRUCTIONS  Take all medicines as directed by your health care provider.  Follow these sleep practices:  Try to get about 8 hours of sleep every night.  Go to sleep and get up close to the same time every day.  Keep your bedroom dark, quiet, and comfortable.  Schedule short naps for when you feel sleepiest during the day. Tell your employer or teachers that you have narcolepsy. You may be able to adjust your schedule to include time for naps.  Try to get at least 20 minutes of exercise every day. This will help you sleep better at night and reduce daytime sleepiness.  Do not drink alcohol or caffeinated beverages within 4-5 hours of bedtime.  Do not eat a heavy meal before bedtime. Eat at about the same times every day.  Do not smoke.  Do not drive if you are sleepy or have untreated narcolepsy.  Do not swim or go out on the water without a life jacket. SEEK MEDICAL CARE IF: Your medicines are not controlling your narcolepsy symptoms. SEEK IMMEDIATE MEDICAL CARE IF:  You hurt yourself during a sleep attack or an attack of cataplexy.  You have chest pain or trouble breathing. Document Released: 01/07/2002 Document Revised: 06/03/2013 Document Reviewed: 01/09/2013 ExitCare Patient Information 2015 [employer]. This information is not intended to replace advice given to you by your health care provider. Make sure you discuss any questions you have with your health care provider.  

## 2013-11-04 ENCOUNTER — Ambulatory Visit: Payer: BC Managed Care – PPO | Admitting: Gynecology

## 2013-11-15 ENCOUNTER — Ambulatory Visit: Payer: No Typology Code available for payment source | Admitting: Gynecology

## 2013-11-15 ENCOUNTER — Other Ambulatory Visit: Payer: Self-pay

## 2013-12-02 ENCOUNTER — Encounter: Payer: Self-pay | Admitting: Neurology

## 2014-01-09 ENCOUNTER — Encounter: Payer: Self-pay | Admitting: Family Medicine

## 2014-01-17 ENCOUNTER — Encounter: Payer: No Typology Code available for payment source | Admitting: Family Medicine

## 2014-03-10 ENCOUNTER — Other Ambulatory Visit: Payer: Self-pay | Admitting: Family Medicine

## 2014-03-10 ENCOUNTER — Telehealth: Payer: Self-pay | Admitting: Family Medicine

## 2014-03-10 ENCOUNTER — Encounter: Payer: Self-pay | Admitting: Family Medicine

## 2014-03-10 DIAGNOSIS — G4733 Obstructive sleep apnea (adult) (pediatric): Secondary | ICD-10-CM

## 2014-03-10 DIAGNOSIS — G4730 Sleep apnea, unspecified: Secondary | ICD-10-CM

## 2014-03-10 MED ORDER — LEVOTHYROXINE SODIUM 50 MCG PO TABS: 50.0000 ug | ORAL_TABLET | Freq: Every day | ORAL | Status: DC

## 2014-03-10 MED ORDER — OLMESARTAN MEDOXOMIL-HCTZ 40-12.5 MG PO TABS: 1.0000 | ORAL_TABLET | Freq: Every day | ORAL | Status: DC

## 2014-03-10 NOTE — Telephone Encounter (Signed)
Pt sees Dr Brett Fairy for sleep apnea

## 2014-03-10 NOTE — Telephone Encounter (Signed)
please advise as to Dx and I will place referral?

## 2014-03-10 NOTE — Telephone Encounter (Signed)
Med filled.  

## 2014-03-10 NOTE — Telephone Encounter (Signed)
Referral placed.

## 2014-03-10 NOTE — Telephone Encounter (Signed)
Caller name:Rena Edmonds-Holman Relationship to patient:Self Can be reached:[phone]   Reason for call: PT has new insurance [employer] ID [id number] and needs referral to see Dr. Asencion Partridge Dohmeier- CPE with Dr. Birdie Riddle set for May 2016- has been seeing Dr. Brett Fairy ( neurology ) regularly

## 2014-03-17 ENCOUNTER — Ambulatory Visit: Payer: Self-pay | Admitting: Certified Nurse Midwife

## 2014-03-31 ENCOUNTER — Telehealth: Payer: Self-pay | Admitting: Neurology

## 2014-03-31 NOTE — Telephone Encounter (Signed)
Patient contacted office needing a new order sent to [facility] for CPAP supplies states she has not had a mask since August of last year. She has requested a lifetime prescription for her supplies.

## 2014-04-02 ENCOUNTER — Telehealth: Payer: Self-pay | Admitting: *Deleted

## 2014-04-02 NOTE — Telephone Encounter (Signed)
Medical record request received via mail from [place]. Forwarded to [employer] at [place]. JG//CMA

## 2014-04-16 ENCOUNTER — Ambulatory Visit (INDEPENDENT_AMBULATORY_CARE_PROVIDER_SITE_OTHER): Payer: 59 | Admitting: Neurology

## 2014-04-16 VITALS — BP 178/105 | HR 70 | Ht 67.5 in | Wt 241.0 lb

## 2014-04-16 DIAGNOSIS — R6889 Other general symptoms and signs: Secondary | ICD-10-CM

## 2014-04-16 DIAGNOSIS — R0683 Snoring: Secondary | ICD-10-CM

## 2014-04-16 DIAGNOSIS — G894 Chronic pain syndrome: Secondary | ICD-10-CM

## 2014-04-16 DIAGNOSIS — N912 Amenorrhea, unspecified: Secondary | ICD-10-CM

## 2014-04-16 DIAGNOSIS — Z9989 Dependence on other enabling machines and devices: Secondary | ICD-10-CM

## 2014-04-16 DIAGNOSIS — G4733 Obstructive sleep apnea (adult) (pediatric): Secondary | ICD-10-CM | POA: Diagnosis not present

## 2014-04-16 DIAGNOSIS — E669 Obesity, unspecified: Secondary | ICD-10-CM

## 2014-04-17 DIAGNOSIS — G4733 Obstructive sleep apnea (adult) (pediatric): Secondary | ICD-10-CM | POA: Diagnosis not present

## 2014-04-17 NOTE — Sleep Study (Signed)
See scanned documents in Encounters tab

## 2014-04-26 ENCOUNTER — Encounter: Payer: Self-pay | Admitting: Neurology

## 2014-04-26 DIAGNOSIS — R0683 Snoring: Secondary | ICD-10-CM

## 2014-04-26 DIAGNOSIS — R6889 Other general symptoms and signs: Secondary | ICD-10-CM

## 2014-04-26 DIAGNOSIS — G4710 Hypersomnia, unspecified: Secondary | ICD-10-CM

## 2014-04-26 DIAGNOSIS — E669 Obesity, unspecified: Secondary | ICD-10-CM

## 2014-04-26 DIAGNOSIS — G894 Chronic pain syndrome: Secondary | ICD-10-CM

## 2014-04-26 DIAGNOSIS — G4733 Obstructive sleep apnea (adult) (pediatric): Secondary | ICD-10-CM

## 2014-04-26 DIAGNOSIS — Z9989 Dependence on other enabling machines and devices: Secondary | ICD-10-CM

## 2014-05-22 DIAGNOSIS — Z0289 Encounter for other administrative examinations: Secondary | ICD-10-CM

## 2014-05-23 ENCOUNTER — Encounter: Payer: Self-pay | Admitting: Neurology

## 2014-05-23 ENCOUNTER — Telehealth: Payer: Self-pay | Admitting: Family Medicine

## 2014-05-23 NOTE — Telephone Encounter (Signed)
Please advise, no record of pt having a CPE with you. Ok to place in an 11 am? Or is there another place you would prefer.

## 2014-05-23 NOTE — Telephone Encounter (Signed)
Ok to use 11am

## 2014-05-23 NOTE — Telephone Encounter (Signed)
Caller name: Eleanore, Junio Relation to pt: self  Call back number: [phone]   Reason for call:  Pt states this is her second time having to [facility] her CPE appointment and would like to be seen much sooner than 10/02/14. Please advise

## 2014-05-26 NOTE — Telephone Encounter (Signed)
Just confirming any 11am 15 minute slot for CPE?

## 2014-05-26 NOTE — Telephone Encounter (Signed)
No- 30 minutes.  All CPE's are 30 minutes.  Ok to place on schedule at pt's convenience in an 11 o'clock slot (which is my 30 minute hospital f/u) but must be for 30 minutes

## 2014-05-26 NOTE — Telephone Encounter (Signed)
Scheduled physical appointment for 06/10/2014

## 2014-05-29 ENCOUNTER — Ambulatory Visit: Payer: 59 | Admitting: Certified Nurse Midwife

## 2014-06-03 ENCOUNTER — Ambulatory Visit (INDEPENDENT_AMBULATORY_CARE_PROVIDER_SITE_OTHER): Payer: 59 | Admitting: Neurology

## 2014-06-03 ENCOUNTER — Encounter: Payer: Self-pay | Admitting: Neurology

## 2014-06-03 VITALS — BP 149/103 | HR 93 | Temp 98.8°F | Ht 66.93 in | Wt 234.0 lb

## 2014-06-03 DIAGNOSIS — G4719 Other hypersomnia: Secondary | ICD-10-CM

## 2014-06-03 DIAGNOSIS — R0683 Snoring: Secondary | ICD-10-CM | POA: Diagnosis not present

## 2014-06-03 DIAGNOSIS — G894 Chronic pain syndrome: Secondary | ICD-10-CM | POA: Diagnosis not present

## 2014-06-03 DIAGNOSIS — G4733 Obstructive sleep apnea (adult) (pediatric): Secondary | ICD-10-CM

## 2014-06-03 DIAGNOSIS — Z9989 Dependence on other enabling machines and devices: Secondary | ICD-10-CM

## 2014-06-03 MED ORDER — ARMODAFINIL 150 MG PO TABS: 150.0000 mg | ORAL_TABLET | Freq: Every day | ORAL | Status: AC

## 2014-06-03 NOTE — Progress Notes (Signed)
Chief Complaint  Patient presents with  . Follow-up    CPAP, alone, rm 11   This patient [name] is seen here today on  06-03-14 , last visit was  10-29-13.  she's a nonsmoker nondrinker and sleep complaints include apnea daytime sleepiness and snoring she was titrated to CPAP in a SPLIT study performed at [facility]  long hospital study and has been here today seen for a CPAP compliance visit. 1)Her compliance report shows that she uses machine faithfully over the last 30 days 100% compliance is noted. Residual AHI is 1.1, the hours of usage of 7 hours and 46 minutes daily. Again 100% compliance. In spite of this the patient continues to be excessively daytime sleepy and fatigued. The patient has HTN, a mitral valve prolapse ,no a fib and no CAD.  The patient assured me she doesn't drive other than in a 10 mile radius. She is driven by husband today. She doesn;t work. Back and gait related problems deferred to her back specialist.   She has reported  vivid dreams.  She has noted her legs buckle when she gets angry, sometimes her speech changes. Cataplexy ?   2)Obstructive sleep apnea, confirmed in a hospital based testing from 08-14-12, the patient reached an AHI of 32.3, a REM AHI of 66.  A nasal Eason mask was used.  Continues to use CPAP -and continues to be sleepier.  chest pain treated by Dr. Gwenlyn Found, [facility] center . As the patient remained excessively daytime sleepy be repeated a CPAP user polysomnography was follow-up of an MS LT test this was performed on 04-16-14 the patient had a full night titration and so she was 44 night using her CPAP. The Epworth sleepiness score was 14 out of 24 and the pH Q9 questionnaire was endorsed at 6 points. BMI was 37. The patient was on Nuvigil. Her apnea index was 2.7 but she did have a significant amount of RDI's respiratory disturbance sees in spite of CPAP use. He was considered valid for M SLT to follow on 04-17-14. The patient had a normal  daytime sleepiness test she only nap in 2 out of 5 nap opportunities was an average sleep latency of 16.3 minutes which is not pathologic. She remains having a high Epworth score but she is not narcoleptic. We also meeting for her compliance she has a 97% compliance by days and 90% by days of use. Average user time is 6 hours and 53 minutes her machine is an AutoSet minimum pressure is 44 cm maximum pressure is 11 cm water EPR is 3. AHI is 0.8 this is an excellent result. I will continue the patient on modafinil for daytime sleepiness and fatigue but I cannot make a diagnosis of narcolepsy.      Last Visit / sleep consult :  [name], right-handed female patient was referred for a sleep study by Dr. Tomma Rakers,  after describing headaches in the morning , snoring and EDS, and is now followed by Dr. Metta Clines.   Headaches would be beginning in the morning, could last all day and she had some dissociative episodes ( possible dream intrusions) . She is known to be a loud snorer.  She was also excessively daytime sleepy and endorsed an Epworth sleepiness score of 17 points at the sleep lab visit, than in the office at 14 points, and today at 17 points again.  FSS over 40 .  She feels better than before PAP therapy. She mainly complains  of fatigue. Chest pain, non cardiac, palpitations. Insomnia. Her gynecologist , Elveria Rising ,  found a murmur and she will see Dr.Weintraub on October 7.th for a Visit. The chest pain and fatigue may be related/  Patient is a veteran and is to used to shiftwork and basically has not regained  her sleep habits from that time on . She goes to bed rather early 7:30 PM and is up at 3:30 she awakened spontaneously does not need an alarm.  Even on days when she may go to bed as late as midnight she will still wake up at 3:30.  Average nocturnal sleep time is estimated to be within 6 hours, her sleep was fragmented due to to residual pain after a spinal fusion surgery.  Pain is localized to the left arm and leg.  Her family has also been concerned about staring spells her daughter had seen about 3 and her husband has visit with her but just one or 2. The patient seems to be staring disconnected to the surrounding ongoing activity appears glazed or impulse is not responding to verbal stimuli. On one occasion this occurred by she was driving on the highway. According to her daughter she looked very does oriented. Her husband was able to switch place as was her and continue the drive. The patient herself seems to be amnestic for these episodes. These have been infrequently occurring since 2006. Seemed to correlate with stress and sleep deprivation.  There's a family history of a sister that used to sleepwalk And likely still does. She herself was not aware of any history of sleep walking and she was not aware of any parental history of a sleep disorder her father used to snore.    There she had never undergone a tonsillectomy or adenoids surgery.  Dr. Tomma Rakers indicated that there was no labored respiration at the time of his visit, her nasal septum was not deviated but the patient has mild micrognathia.  Meanwhile , [name] had undergone a sleep study 08-13-12 at [facility] , interpreted by Dr. Gwenette Greet. Sleep study revealed 96% sleep efficiency an AHI of 32.3 and an RDI of 44.8. She was titrated first of CPAP but seemed to have responded to this very is central apneas. She was finally titrated to 10/6 cm water and then 11/6 cm water with an AHI of 0 a sleep efficiency of 87% the total sleep time of only 10 minutes of which moles was REM sleep was observed. I was also today reviewing an old of office download to advanced on care, for 30 days of initial use of CPAP or BiPAP and her case was issue at 09/22/2012 first 30 days shortly 97% he was 90% of these days are over 4 hours.  Average therapy time per day as 5 hours and 18 minutes and her residual AHI is 0.8. The patient is  an older set machine right now with a maximum pressure of 11 and a minimum pressure of 4 cm water and in EPR of 3.  The 95th percentile pressure is 7.6 cm H20, - the machine could  be set at 8 (which is the closest pressure available),  but if her insurance allows her to stay on auto- set machine, I would be in favor of continuing to use as.  Her husband has noticed that she snores less or not at all, and she feels better rested in the morning, she has a left dry mouth in the morning. Last night she  still had 5 bathroom breaks but this is not rule, in general it is to bathroom breaks. Prior to using the machine she had 25 bathroom breaks. Again today's Epworth 17 points and theFS S.  score 54 points. She will undergo a cardiology evaluation next week established which complement of fatigue could be cardiac related given the discovery of [place]. Given that her baseline obstructive sleep apnea was moderate to severe would like the patient to continue the therapy at the current settings and was at times must but seems to do well and produces not a high air leak. She uses a nasal mask, not pillows. Phillips respironics model.     Physical exam:  General: The patient is awake, alert and appears not in acute distress. The patient is well groomed.  Head: Normocephalic, atraumatic. Neck is supple. Mallampati 4 , neck circumference: 14  Inches , no nasal deviation, no TMJ ,  Cardiovascular: Regular rate and rhythm,  Respiratory: Lungs are clear to auscultation.  Skin: Without evidence of edema, or rash  Trunk: BMI is 35, elevated. Neurologic exam :  The patient is awake and alert, oriented to place and time. Memory subjective described as intact. There is a normal attention span & concentration ability.  Speech is fluent without dysarthria, dysphonia or aphasia. Mood and affect are appropriate. She is worried about the cardiac murmur.  Cranial nerves:  Pupils are equal and briskly reactive to light. Hearing  to finger rub intact. Facial sensation intact to fine touch, tongue and uvula move midline.  Motor exam: Normal tone ,normal muscle bulk and symmetric normal strength in all extremities. Sensory: Fine touch, pinprick and vibration were normal .  She has a dysesthesia of burning , heat in the right leg ( L4-5 dermatome), on the left side there is straight leg rising induce hamstring pain and numbness with a deeper ache radiating to the lateral foot, L5-S1 . Proprioception in the upper extremities is normal.  Coordination: Rapid alternating movements in the fingers/hands is tested and normal. Finger-to-nose maneuver tested and normal without evidence of ataxia, dysmetria or tremor.Stance is stable and normal. Tandem gait is unfragmented. Romberg testing is ormal.  Deep tendon reflexes: in the upper and lower extremities are attenuated - but symmetric and intact.   Assessment:   1)  the patient shows good compliance with CPAP use and actually a very good control of her obstructive sleep apnea on CPAP. She remains fatigue and daytime sleepy with an Epworth score 15 and the fatigue severity score 55- while  being very compliant with CPAP use. The MS LT was normal. I do wonder if she is depressed.  . Plan: Treatment plan and additional workup :  Continue Auto PAP from 4-11 cm water with 3 cm flex.   Nuvigil prescribed.  She tried a sample pack of Nuvigil prior to her MS LT but had never a prescription for Nuvigil. The patient today presents with an upper respiratory infection would show like for her to take to bed take some Tylenol get some extra sleep and make it make sure that she has a humidified room or takes a steamy shower before going to bed.

## 2014-06-03 NOTE — Patient Instructions (Signed)
Armodafinil tablets What is this medicine? ARMODAFINIL (ar moe DAF i nil) is used to treat excessive sleepiness caused by certain sleep disorders. This includes narcolepsy, sleep apnea, and shift work sleep disorder. This medicine may be used for other purposes; ask your health care provider or pharmacist if you have questions. COMMON BRAND NAME(S): Nuvigil What should I tell my health care provider before I take this medicine? They need to know if you have any of these conditions: -kidney disease -liver disease -an unusual or allergic reaction to armodafinil, modafinil, medicines, foods, dyes, or preservatives -pregnant or trying to get pregnant -breast-feeding How should I use this medicine? Take this medicine by mouth with a glass of water. Follow the directions on the prescription label. Take your doses at regular intervals. Do not take your medicine more often than directed. Do not stop taking except on your doctor's advice. A special MedGuide will be given to you by the pharmacist with each prescription and refill. Be sure to read this information carefully each time. Talk to your pediatrician regarding the use of this medicine in children. While this drug may be prescribed for children as young as 66 years of age for selected conditions, precautions do apply. Overdosage: If you think you have taken too much of this medicine contact a poison control center or emergency room at once. NOTE: This medicine is only for you. Do not share this medicine with others. What if I miss a dose? If you miss a dose, take it as soon as you can. If it is almost time for your next dose, take only that dose. Do not take double or extra doses. What may interact with this medicine? Do not take this medicine with any of the following medications: -amphetamine or dextroamphetamine -dexmethylphenidate or methylphenidate -MAOIs like Carbex, Eldepryl, Marplan, Nardil, and Parnate -pemoline -procarbazine This  medicine may also interact with the following medications: -antifungal medicines like itraconazole or ketoconazole -barbiturates, like phenobarbital -birth control pills or other hormone-containing birth control devices or implants -carbamazepine -cyclosporine -diazepam -medicines for depression, anxiety, or psychotic disturbances -phenytoin -propranolol -triazolam -warfarin This list may not describe all possible interactions. Give your health care provider a list of all the medicines, herbs, non-prescription drugs, or dietary supplements you use. Also tell them if you smoke, drink alcohol, or use illegal drugs. Some items may interact with your medicine. What should I watch for while using this medicine? Visit your doctor or health care professional for regular checks on your progress. The full effect of this medicine may not be seen right away. This medicine may affect your concentration, function, or may hide signs that you are tired. You may get dizzy. Do not drive, use machinery, or do anything that needs mental alertness until you know how this drug affects you. Alcohol can make you more dizzy and may interfere with your response to this medicine or your alertness. Avoid alcoholic drinks. Birth control pills may not work properly while you are taking this medicine. Talk to your doctor about using an extra method of birth control. It is unknown if the effects of this medicine will be increased by the use of caffeine. Caffeine is found in many foods, beverages, and medications. Ask your doctor if you should limit or change your intake of caffeine-containing products while on this medicine. What side effects may I notice from receiving this medicine? Side effects that you should report to your doctor or health care professional as soon as possible: -allergic reactions like skin  rash, itching or hives, swelling of the face, lips, or tongue -breathing problems -chest pain -fast, irregular  heartbeat -increased blood pressure, particularly if you have high blood pressure -mental problems -sore throat, fever, or chills -tremors -vomiting Side effects that usually do not require medical attention (report to your doctor or health care professional if they continue or are bothersome): -headache -nausea, diarrhea, or stomach upset -nervousness -trouble sleeping This list may not describe all possible side effects. Call your doctor for medical advice about side effects. You may report side effects to FDA at 1-800-FDA-1088. Where should I keep my medicine? Keep out of the reach of children. Store at room temperature between 20 and 25 degrees C (68 and 77 degrees F). Throw away any unused medicine after the expiration date. NOTE: This sheet is a summary. It may not cover all possible information. If you have questions about this medicine, talk to your doctor, pharmacist, or health care provider.  2015, Elsevier/Gold Standard. (2008-12-30 21:10:28)  

## 2014-06-04 ENCOUNTER — Ambulatory Visit: Payer: 59 | Admitting: Certified Nurse Midwife

## 2014-06-09 ENCOUNTER — Telehealth: Payer: Self-pay | Admitting: *Deleted

## 2014-06-09 NOTE — Telephone Encounter (Signed)
Patient cancelled appointment due to insurance issues.

## 2014-06-10 ENCOUNTER — Encounter: Payer: No Typology Code available for payment source | Admitting: Family Medicine

## 2014-06-20 ENCOUNTER — Encounter: Payer: No Typology Code available for payment source | Admitting: Family Medicine

## 2014-08-06 ENCOUNTER — Encounter: Payer: Self-pay | Admitting: Family Medicine

## 2014-08-08 ENCOUNTER — Other Ambulatory Visit: Payer: Self-pay | Admitting: General Practice

## 2014-08-08 MED ORDER — LEVOTHYROXINE SODIUM 50 MCG PO TABS: 50.0000 ug | ORAL_TABLET | Freq: Every day | ORAL | Status: DC

## 2014-08-08 MED ORDER — OLMESARTAN MEDOXOMIL-HCTZ 40-12.5 MG PO TABS: 1.0000 | ORAL_TABLET | Freq: Every day | ORAL | Status: DC

## 2014-08-22 ENCOUNTER — Other Ambulatory Visit: Payer: Self-pay | Admitting: Family Medicine

## 2014-08-22 MED ORDER — LEVOTHYROXINE SODIUM 50 MCG PO TABS: 50.0000 ug | ORAL_TABLET | Freq: Every day | ORAL | Status: DC

## 2014-08-22 MED ORDER — OLMESARTAN MEDOXOMIL-HCTZ 40-12.5 MG PO TABS: 1.0000 | ORAL_TABLET | Freq: Every day | ORAL | Status: DC

## 2014-08-22 NOTE — Telephone Encounter (Signed)
Will provide #30 of each, no additional med refills w/o appt as pt has not been seen in over a year.  Must schedule BP f/u within the next month.

## 2014-08-22 NOTE — Telephone Encounter (Signed)
Spoke with patient and she said she does not have her Medicare yet and hoping to have it 09/01/14. I advised Dr.Tabori and she advised the patient care try [facility] in the meantime. I made the patient aware and gave her the number.she verbalized understanding and agreed.    KP

## 2014-08-22 NOTE — Addendum Note (Signed)
Addended by: Ewing Schlein on: 08/22/2014 05:04 PM   Modules accepted: Orders

## 2014-08-22 NOTE — Telephone Encounter (Signed)
Patient has not been seen since 05/13/13, she canceled 10/02/14 apt and No other pending appointments. Please advise    KP

## 2014-09-23 ENCOUNTER — Encounter: Payer: Self-pay | Admitting: Physician Assistant

## 2014-09-23 ENCOUNTER — Ambulatory Visit (INDEPENDENT_AMBULATORY_CARE_PROVIDER_SITE_OTHER): Payer: 59 | Admitting: Physician Assistant

## 2014-09-23 VITALS — BP 178/108 | HR 68 | Temp 98.0°F | Resp 16 | Ht 67.0 in | Wt 235.0 lb

## 2014-09-23 DIAGNOSIS — K5732 Diverticulitis of large intestine without perforation or abscess without bleeding: Secondary | ICD-10-CM | POA: Diagnosis not present

## 2014-09-23 LAB — URINALYSIS, ROUTINE W REFLEX MICROSCOPIC
Bilirubin Urine: NEGATIVE
HGB URINE DIPSTICK: NEGATIVE
KETONES UR: NEGATIVE
NITRITE: NEGATIVE
Specific Gravity, Urine: 1.015 (ref 1.000–1.030)
Total Protein, Urine: NEGATIVE
URINE GLUCOSE: NEGATIVE
Urobilinogen, UA: 0.2 (ref 0.0–1.0)
pH: 7 (ref 5.0–8.0)

## 2014-09-23 MED ORDER — POTASSIUM CHLORIDE CRYS ER 20 MEQ PO TBCR: 20.0000 meq | EXTENDED_RELEASE_TABLET | Freq: Every day | ORAL | Status: DC

## 2014-09-23 MED ORDER — LEVOTHYROXINE SODIUM 50 MCG PO TABS: 50.0000 ug | ORAL_TABLET | Freq: Every day | ORAL | Status: DC

## 2014-09-23 MED ORDER — CIPROFLOXACIN HCL 500 MG PO TABS: 500.0000 mg | ORAL_TABLET | Freq: Two times a day (BID) | ORAL | Status: DC

## 2014-09-23 MED ORDER — METRONIDAZOLE 500 MG PO TABS: 500.0000 mg | ORAL_TABLET | Freq: Three times a day (TID) | ORAL | Status: DC

## 2014-09-23 MED ORDER — OLMESARTAN MEDOXOMIL-HCTZ 40-12.5 MG PO TABS: 1.0000 | ORAL_TABLET | Freq: Every day | ORAL | Status: DC

## 2014-09-23 NOTE — Patient Instructions (Signed)
Please stop by the lab for a urine sample and to pick up a stool kit. Please take antibiotics as directed with food.  Follow-up if symptoms not improving over 48 hours. If fever or bloody stool develop, call 911 or go directly to the ER. Again if not improving swiftly, we will need to get a CT scan and blood work.  Diverticulitis Diverticulitis is inflammation or infection of small pouches in your colon that form when you have a condition called diverticulosis. The pouches in your colon are called diverticula. Your colon, or large intestine, is where water is absorbed and stool is formed. Complications of diverticulitis can include:  Bleeding.  Severe infection.  Severe pain.  Perforation of your colon.  Obstruction of your colon. CAUSES  Diverticulitis is caused by bacteria. Diverticulitis happens when stool becomes trapped in diverticula. This allows bacteria to grow in the diverticula, which can lead to inflammation and infection. RISK FACTORS People with diverticulosis are at risk for diverticulitis. Eating a diet that does not include enough fiber from fruits and vegetables may make diverticulitis more likely to develop. SYMPTOMS  Symptoms of diverticulitis may include:  Abdominal pain and tenderness. The pain is normally located on the left side of the abdomen, but may occur in other areas.  Fever and chills.  Bloating.  Cramping.  Nausea.  Vomiting.  Constipation.  Diarrhea.  Blood in your stool. DIAGNOSIS  Your health care provider will ask you about your medical history and do a physical exam. You may need to have tests done because many medical conditions can cause the same symptoms as diverticulitis. Tests may include:  Blood tests.  Urine tests.  Imaging tests of the abdomen, including X-rays and CT scans. When your condition is under control, your health care provider may recommend that you have a colonoscopy. A colonoscopy can show how severe your  diverticula are and whether something else is causing your symptoms. TREATMENT  Most cases of diverticulitis are mild and can be treated at home. Treatment may include:  Taking over-the-counter pain medicines.  Following a clear liquid diet.  Taking antibiotic medicines by mouth for 7-10 days. More severe cases may be treated at a hospital. Treatment may include:  Not eating or drinking.  Taking prescription pain medicine.  Receiving antibiotic medicines through an IV tube.  Receiving fluids and nutrition through an IV tube.  Surgery. HOME CARE INSTRUCTIONS   Follow your health care provider's instructions carefully.  Follow a full liquid diet or other diet as directed by your health care provider. After your symptoms improve, your health care provider may tell you to change your diet. He or she may recommend you eat a high-fiber diet. Fruits and vegetables are good sources of fiber. Fiber makes it easier to pass stool.  Take fiber supplements or probiotics as directed by your health care provider.  Only take medicines as directed by your health care provider.  Keep all your follow-up appointments. SEEK MEDICAL CARE IF:   Your pain does not improve.  You have a hard time eating food.  Your bowel movements do not return to normal. SEEK IMMEDIATE MEDICAL CARE IF:   Your pain becomes worse.  Your symptoms do not get better.  Your symptoms suddenly get worse.  You have a fever.  You have repeated vomiting.  You have bloody or black, tarry stools. MAKE SURE YOU:   Understand these instructions.  Will watch your condition.  Will get help right away if you are not  doing well or get worse. Document Released: 10/27/2004 Document Revised: 01/22/2013 Document Reviewed: 12/12/2012 [facility] Patient Information 2015 [place], [place]. This information is not intended to replace advice given to you by your health care provider. Make sure you discuss any questions you have  with your health care provider.

## 2014-09-23 NOTE — Assessment & Plan Note (Signed)
High suspicion of mild diverticulitis. Patient refuses labs or imaging. Will agree to UA and stool study. Rx Cipro 500 mg BID and Flagyl 500 mg TID x 7 days. Supportive measures reviewed. Follow-up if not improving some over next 48 hours as we will need to proceed with CT. ER if anything worsens.

## 2014-09-23 NOTE — Progress Notes (Signed)
Patient presents to clinic today c/o LLQ pain with alternating loose stools and hard stools. Denies fever, chills. Endorses mild nausea but denies emesis. Denies hematochezia, tenesmus or melena. Denies urinary symptoms. Denies recent travel or sick contact. Endorses prior colonoscopy with outside health system. No record for review in EMR. Is unsure of diverticulosis but notes that sounds familiar.  Past Medical History  Diagnosis Date  . Chicken pox   . Migraine   . Heart murmur   . Hypertension   . Anemia   . Retinoschisis   . Mitral valve prolapse   . Leg muscle spasm   . 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 High cholesterol   . Hernia, hiatal   . History of mitral valve prolapse   . Obstructive sleep apnea (adult) (pediatric) 08/31/2012    Current Outpatient Prescriptions on File Prior to Visit  Medication Sig Dispense Refill  . Armodafinil 150 MG tablet Take 1 tablet (150 mg total) by mouth daily. 30 tablet 4  . atorvastatin (LIPITOR) 20 MG tablet Take 1 tablet (20 mg total) by mouth daily. 90 tablet 3   No current facility-administered medications on file prior to visit.    Allergies  Allergen Reactions  . Lyrica [Pregabalin] Other (See Comments)    Hallcinations    Family History  Problem Relation Age of Onset  . Cancer - Prostate Father   . Stroke Father   . Diabetes Father     Social History   Social History  . Marital Status: Married    Spouse Name: N/A  . Number of Children: 4  . Years of Education: 14   Occupational History  . unemployed Theme park manager    in Psychologist, occupational for disability    Social History Main Topics  . Smoking status: Never Smoker   . Smokeless tobacco: Never Used  . Alcohol Use: No  . Drug Use: No  . Sexual Activity:    Partners: Male    Birth Control/ Protection: Other-see comments     Comment: vasectomy   Other Topics Concern  . None   Social History Narrative   No caffeine use.   Review of Systems - See  HPI.  All other ROS are negative.  BP 178/108 mmHg  Pulse 68  Temp(Src) 98 F (36.7 C) (Oral)  Resp 16  Ht 5\' 7"  (1.702 m)  Wt 235 lb (106.595 kg)  BMI 36.80 kg/m2  SpO2 98%  LMP 11/21/2012  Physical Exam  Constitutional: She is oriented to person, place, and time and well-developed, well-nourished, and in no distress.  HENT:  Head: Normocephalic and atraumatic.  Cardiovascular: Normal rate, regular rhythm, normal heart sounds and intact distal pulses.   Pulmonary/Chest: Effort normal and breath sounds normal. No respiratory distress. She has no wheezes. She has no rales. She exhibits no tenderness.  Abdominal: Soft. Normal appearance, normal aorta and bowel sounds are normal. There is no hepatosplenomegaly. There is tenderness in the left lower quadrant. There is no rigidity, no rebound, no guarding, no CVA tenderness, no tenderness at McBurney's point and negative Murphy's sign. No hernia.  Neurological: She is alert and oriented to person, place, and time.  Skin: Skin is warm and dry. No rash noted.  Psychiatric: Affect normal.  Vitals reviewed.   No results found for this or any previous visit (from the past 2160 hour(s)).  Assessment/Plan: Diverticulitis of colon High suspicion of mild diverticulitis. Patient refuses labs or imaging. Will agree to UA and  stool study. Rx Cipro 500 mg BID and Flagyl 500 mg TID x 7 days. Supportive measures reviewed. Follow-up if not improving some over next 48 hours as we will need to proceed with CT. ER if anything worsens.

## 2014-09-23 NOTE — Progress Notes (Signed)
Pre visit review using our clinic review tool, if applicable. No additional management support is needed unless otherwise documented below in the visit note/SLS  

## 2014-10-02 ENCOUNTER — Encounter: Payer: No Typology Code available for payment source | Admitting: Family Medicine

## 2014-10-22 ENCOUNTER — Other Ambulatory Visit: Payer: Self-pay | Admitting: Physician Assistant

## 2014-10-23 ENCOUNTER — Other Ambulatory Visit: Payer: Self-pay | Admitting: Family Medicine

## 2014-10-23 NOTE — Telephone Encounter (Signed)
Will defer refills to PCP

## 2014-10-23 NOTE — Telephone Encounter (Signed)
Cannot fill for #90 due to pt not being seen in over a year.

## 2014-10-23 NOTE — Telephone Encounter (Signed)
Ok for #30 for each but this will be last refill w/o appt (pt has not been seen since April 2015)

## 2014-10-23 NOTE — Telephone Encounter (Signed)
Medication filled to pharmacy as requested. Letter also mailed to pt.

## 2014-10-30 ENCOUNTER — Encounter: Payer: Self-pay | Admitting: Family Medicine

## 2014-10-30 ENCOUNTER — Ambulatory Visit: Payer: 59 | Admitting: Family Medicine

## 2014-10-30 ENCOUNTER — Ambulatory Visit (INDEPENDENT_AMBULATORY_CARE_PROVIDER_SITE_OTHER): Payer: 59 | Admitting: Family Medicine

## 2014-10-30 VITALS — BP 156/86 | HR 102 | Temp 98.1°F | Resp 16 | Ht 67.0 in | Wt 228.1 lb

## 2014-10-30 DIAGNOSIS — D259 Leiomyoma of uterus, unspecified: Secondary | ICD-10-CM

## 2014-10-30 DIAGNOSIS — Z1231 Encounter for screening mammogram for malignant neoplasm of breast: Secondary | ICD-10-CM

## 2014-10-30 DIAGNOSIS — Z1211 Encounter for screening for malignant neoplasm of colon: Secondary | ICD-10-CM

## 2014-10-30 DIAGNOSIS — Z0000 Encounter for general adult medical examination without abnormal findings: Secondary | ICD-10-CM

## 2014-10-30 DIAGNOSIS — Z23 Encounter for immunization: Secondary | ICD-10-CM

## 2014-10-30 DIAGNOSIS — I10 Essential (primary) hypertension: Secondary | ICD-10-CM

## 2014-10-30 LAB — CBC WITH DIFFERENTIAL/PLATELET
Basophils Absolute: 0 10*3/uL (ref 0.0–0.1)
Basophils Relative: 0.3 % (ref 0.0–3.0)
EOS PCT: 0.9 % (ref 0.0–5.0)
Eosinophils Absolute: 0 10*3/uL (ref 0.0–0.7)
HCT: 41.5 % (ref 36.0–46.0)
Hemoglobin: 13.4 g/dL (ref 12.0–15.0)
LYMPHS ABS: 2.2 10*3/uL (ref 0.7–4.0)
Lymphocytes Relative: 41.5 % (ref 12.0–46.0)
MCHC: 32.4 g/dL (ref 30.0–36.0)
MCV: 83.4 fl (ref 78.0–100.0)
MONO ABS: 0.3 10*3/uL (ref 0.1–1.0)
Monocytes Relative: 6.3 % (ref 3.0–12.0)
NEUTROS ABS: 2.7 10*3/uL (ref 1.4–7.7)
NEUTROS PCT: 51 % (ref 43.0–77.0)
PLATELETS: 222 10*3/uL (ref 150.0–400.0)
RBC: 4.98 Mil/uL (ref 3.87–5.11)
RDW: 15.2 % (ref 11.5–15.5)
WBC: 5.2 10*3/uL (ref 4.0–10.5)

## 2014-10-30 LAB — BASIC METABOLIC PANEL
BUN: 10 mg/dL (ref 6–23)
CHLORIDE: 103 meq/L (ref 96–112)
CO2: 30 mEq/L (ref 19–32)
Calcium: 10.5 mg/dL (ref 8.4–10.5)
Creatinine, Ser: 0.82 mg/dL (ref 0.40–1.20)
GFR: 94.4 mL/min (ref >60.00)
GLUCOSE: 89 mg/dL (ref 70–99)
POTASSIUM: 3.1 meq/L — AB (ref 3.5–5.1)
Sodium: 140 mEq/L (ref 135–145)

## 2014-10-30 LAB — LIPID PANEL
CHOLESTEROL: 238 mg/dL — AB (ref 0–200)
HDL: 66.4 mg/dL (ref >39.00)
LDL Cholesterol: 156 mg/dL — ABNORMAL HIGH (ref 0–99)
NonHDL: 171.27
TRIGLYCERIDES: 76 mg/dL (ref 0.0–149.0)
Total CHOL/HDL Ratio: 4
VLDL: 15.2 mg/dL (ref 0.0–40.0)

## 2014-10-30 LAB — HEPATIC FUNCTION PANEL
ALBUMIN: 4.1 g/dL (ref 3.5–5.2)
ALK PHOS: 78 U/L (ref 39–117)
ALT: 10 U/L (ref 0–35)
AST: 17 U/L (ref 0–37)
BILIRUBIN DIRECT: 0.1 mg/dL (ref 0.0–0.3)
TOTAL PROTEIN: 8 g/dL (ref 6.0–8.3)
Total Bilirubin: 0.8 mg/dL (ref 0.2–1.2)

## 2014-10-30 LAB — TSH: TSH: 2.13 u[IU]/mL (ref 0.35–4.50)

## 2014-10-30 LAB — VITAMIN D 25 HYDROXY (VIT D DEFICIENCY, FRACTURES): VITD: 11.81 ng/mL — ABNORMAL LOW (ref 30.00–100.00)

## 2014-10-30 NOTE — Assessment & Plan Note (Signed)
Pt's PE unchanged from previous.  Requesting referral to GYN- order entered.  Due for mammo- order entered.  Referred back to GI for possible repeat colonoscopy.  Pt UTD on Tdap.  Flu given today.  Discussed importance of healthy diet and regular exercise.  Anticipatory guidance provided.

## 2014-10-30 NOTE — Progress Notes (Signed)
Pre visit review using our clinic review tool, if applicable. No additional management support is needed unless otherwise documented below in the visit note. 

## 2014-10-30 NOTE — Patient Instructions (Signed)
Follow up in 4-6 weeks to recheck BP We'll notify you of your lab results and make any changes if needed Continue to work on healthy diet and regular exercise We'll call you with your GYN and mammo appts We'll call you about your GI appt for the colonoscopy Call with any questions or concerns If you want to join [place] at the new [place] office, any scheduled appointments will automatically transfer and we will see you at [address] [place] [address] [place], [place] [address] Happy Fall!
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
## 2014-10-30 NOTE — Assessment & Plan Note (Signed)
Not at goal.  Pt reports good compliance w/ meds and BP is better than at last visit.  Asymptomatic at this time.  Pt reports stressful morning prior to arrival.  Will have her return in 1 month to recheck BP and adjust meds prn.  Stressed need for healthy diet and regular exercise and commended her on her weight loss efforts.  Will follow closely.

## 2014-11-05 ENCOUNTER — Encounter: Payer: Self-pay | Admitting: Gastroenterology

## 2014-11-05 ENCOUNTER — Other Ambulatory Visit: Payer: Self-pay | Admitting: General Practice

## 2014-11-05 DIAGNOSIS — E876 Hypokalemia: Secondary | ICD-10-CM

## 2014-11-05 MED ORDER — POTASSIUM CHLORIDE CRYS ER 20 MEQ PO TBCR: 20.0000 meq | EXTENDED_RELEASE_TABLET | Freq: Two times a day (BID) | ORAL | Status: AC

## 2014-11-05 MED ORDER — VITAMIN D (ERGOCALCIFEROL) 1.25 MG (50000 UNIT) PO CAPS: 50000.0000 [IU] | ORAL_CAPSULE | ORAL | Status: AC

## 2014-11-10 ENCOUNTER — Encounter: Payer: 59 | Admitting: Obstetrics & Gynecology

## 2014-11-24 ENCOUNTER — Encounter: Payer: 59 | Admitting: Obstetrics & Gynecology

## 2014-11-27 ENCOUNTER — Ambulatory Visit: Payer: 59 | Admitting: Family Medicine

## 2014-12-04 ENCOUNTER — Ambulatory Visit: Payer: 59 | Admitting: Adult Health

## 2015-01-16 ENCOUNTER — Encounter: Payer: 59 | Admitting: Gastroenterology

## 2015-02-04 ENCOUNTER — Encounter: Payer: 59 | Admitting: Family Medicine

## 2017-06-20 ENCOUNTER — Encounter: Payer: Self-pay | Admitting: General Practice

## 2022-08-22 ENCOUNTER — Ambulatory Visit (HOSPITAL_COMMUNITY)
Admission: EM | Admit: 2022-08-22 | Discharge: 2022-08-22 | Disposition: A | Discharge status: Home / Self Care | Payer: No Typology Code available for payment source | Attending: Emergency Medicine | Admitting: Emergency Medicine

## 2022-08-22 ENCOUNTER — Encounter (HOSPITAL_COMMUNITY): Payer: Self-pay

## 2022-08-22 DIAGNOSIS — B09 Unspecified viral infection characterized by skin and mucous membrane lesions: Secondary | ICD-10-CM | POA: Diagnosis not present

## 2022-08-22 MED ORDER — LIDOCAINE 5 % EX OINT: 1.0000 | TOPICAL_OINTMENT | Freq: Four times a day (QID) | CUTANEOUS | 0 refills | Status: DC | PRN

## 2022-08-22 NOTE — Discharge Instructions (Signed)
The rash that you have today appears to be related to viral infection of unknown origin.  It appears to be similar to the virus caused by the coxsackievirus which is a member of the larger class of enteroviruses.  These viruses are not fatal and are not known to cause complications requiring hospitalization or specific medical treatment.  Conservative care is recommended which you are already doing but keeping the wounds clean and dry and applying antibiotic ointment as the blisters open and weep.  I provided you with a prescription for lidocaine ointment that you can apply to the affected areas 3-4 times daily as needed for relief of pain and itching.  If you have not had resolution of the rash within the next week to 10 days, please consider following up with your primary care provider for further evaluation and possible skin testing.  Thank you for visiting [facility] today.

## 2022-08-22 NOTE — ED Provider Notes (Signed)
[facility]    CSN: [id number] Arrival date & time: 08/22/22  1315    HISTORY   Chief Complaint  Patient presents with   Rash   HPI Alicia Huff is a pleasant, 57 y.o. female who presents to urgent care today. Patient complains of rash on various parts of her body including both arms, both legs and lower back for the past 3 weeks.  Patient states the rash appeared after feeling poorly for several days, states she was fatigued and feeling run down but denies having fever or any other symptoms.  Patient states lesions appear initially as firm red bumps that progress to small blisters that burst, scab over and then heal.  Patient believes the cycle of these lesions is about 3 to 4 days.  Patient states she has been keeping all areas clean and dry and applying antibiotic ointment.  Patient states that the lesions sting and burn.  The history is provided by the patient.   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)    Leg muscle spasm    Migraine    Mitral valve prolapse    Obstructive sleep apnea (adult) (pediatric) 08/31/2012   Retinoschisis    Patient Active Problem List   Diagnosis Date Noted   Physical exam 10/30/2014   Diverticulitis of colon 09/23/2014   History of mitral valve prolapse 11/06/2012   Atypical chest pain 11/06/2012   Hyperlipidemia 11/06/2012   Sleep apnea with use of continuous positive airway pressure (CPAP) 10/29/2012   Obstructive sleep apnea 08/31/2012   Headache(784.0) 07/02/2012   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   Other and unspecified hyperlipidemia 06/01/2012   Unspecified hypothyroidism 06/01/2012   Ganglion cyst of wrist 06/01/2012   Amenorrhea 05/11/2011   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   HTN (hypertension) 05/11/2011   S/P lumbar fusion 05/11/2011    Chronic lumbar pain 05/11/2011   Past Surgical History:  Procedure Laterality Date   BREAST SURGERY  1992 & 1993   DILATATION & CURETTAGE/HYSTEROSCOPY WITH TRUECLEAR N/A 12/10/2012   Procedure: DILATATION & CURETTAGE/HYSTEROSCOPY WITH TRUCLEAR;  Surgeon: Bennye Alm, MD;  Location: [facility];  Service: Gynecology;  Laterality: N/A;   HERNIA REPAIR  1999   Umbilical repair   SPINE SURGERY  2009   fusion   OB History     Gravida  4   Para  4   Term      Preterm      AB      Living  4      SAB      IAB      Ectopic      Multiple      Live Births             Home Medications    Prior to Admission medications   Medication Sig Start Date End Date Taking? Authorizing Provider  atorvastatin (LIPITOR) 20 MG tablet Take 1 tablet (20 mg total) by mouth daily. 05/13/13  Yes Sheliah Hatch, MD  lidocaine (XYLOCAINE) 5 % ointment Apply 1 Application topically 4 (four) times daily as needed. 08/22/22  Yes Theadora Rama Scales, PA-C  Armodafinil 150 MG tablet Take 1 tablet (150 mg total) by mouth daily. 06/03/14   Dohmeier, Porfirio Mylar, MD  BENICAR HCT  40-12.5 MG per tablet TAKE 1 TABLET BY MOUTH DAILY 10/23/14   Sheliah Hatch, MD  Biotin 5000 MCG CAPS Take 1 each by mouth daily.    [provider]  levothyroxine (SYNTHROID, LEVOTHROID) 50 MCG tablet TAKE 1 TABLET(50 MCG) BY MOUTH DAILY 10/23/14   Sheliah Hatch, MD  multivitamin-lutein [facility]) CAPS capsule Take 1 capsule by mouth daily.    [provider]  potassium chloride SA (K-DUR,KLOR-CON) 20 MEQ tablet Take 1 tablet (20 mEq total) by mouth 2 (two) times daily. 11/05/14   Sheliah Hatch, MD  Vitamin D, Ergocalciferol, (DRISDOL) 50000 UNITS CAPS capsule Take 1 capsule (50,000 Units total) by mouth every 7 (seven) days. 11/05/14   Sheliah Hatch, MD    Family History Family History  Problem Relation Age of Onset   Cancer - Prostate Father    Stroke Father    Diabetes Father     Social History Social History   Tobacco Use   Smoking status: Never   Smokeless tobacco: Never  Substance Use Topics   Alcohol use: No   Drug use: No   Allergies   Lyrica [pregabalin]  Review of Systems Review of Systems Pertinent findings revealed after performing a 14 point review of systems has been noted in the history of present illness.  Physical Exam Vital Signs BP (!) 162/101 (BP Location: Left Arm)   Pulse 93   Temp 98.1 F (36.7 C) (Oral)   Resp 18   LMP 11/21/2012   SpO2 93%   No data found.  Physical Exam Vitals and nursing note reviewed.  Constitutional:      General: She is not in acute distress.    Appearance: Normal appearance.  HENT:     Head: Normocephalic and atraumatic.  Eyes:     Pupils: Pupils are equal, round, and reactive to light.  Cardiovascular:     Rate and Rhythm: Normal rate and regular rhythm.  Pulmonary:     Effort: Pulmonary effort is normal.     Breath sounds: Normal breath sounds.  Musculoskeletal:        General: Normal range of motion.     Cervical back: Normal range of motion and neck supple.  Skin:    General: Skin is warm and dry.     Findings: Lesion (2 cm areas of erythema, induration, TTP with small vesicular lesions at various stages of healing present on left upper arm, RLE and right forearm) present.  Neurological:     General: No focal deficit present.     Mental Status: She is alert and oriented to person, place, and time. Mental status is at baseline.  Psychiatric:        Mood and Affect: Mood normal.        Behavior: Behavior normal.        Thought Content: Thought content normal.        Judgment: Judgment normal.     Visual Acuity Right Eye Distance:   Left Eye Distance:   Bilateral Distance:    Right Eye Near:   Left Eye Near:    Bilateral Near:     UC Couse / Diagnostics / Procedures:     Radiology No results found.  Procedures Procedures (including critical care time) EKG  Pending  results:  Labs Reviewed - No data to display  Medications Ordered in UC: Medications - No data to display  UC Diagnoses / Final Clinical Impressions(s)   I have reviewed the triage vital  signs and the nursing notes.  Pertinent labs & imaging results that were available during my care of the patient were reviewed by me and considered in my medical decision making (see chart for details).    Final diagnoses:  Viral exanthem   Pt advised rash is likely viral in etiology.  Pt provided with lidocaine oint for topical pain relief.  Pt advised to f/u with PCP in 7-10 days if not resolved.   Please see discharge instructions below for details of plan of care as provided to patient. ED Prescriptions     Medication Sig Dispense Auth. Provider   lidocaine (XYLOCAINE) 5 % ointment Apply 1 Application topically 4 (four) times daily as needed. 35.44 g Theadora Rama Scales, PA-C      PDMP not reviewed this encounter.  Pending results:  Labs Reviewed - No data to display  Discharge Instructions:   Discharge Instructions      The rash that you have today appears to be related to viral infection of unknown origin.  It appears to be similar to the virus caused by the coxsackievirus which is a member of the larger class of enteroviruses.  These viruses are not fatal and are not known to cause complications requiring hospitalization or specific medical treatment.  Conservative care is recommended which you are already doing but keeping the wounds clean and dry and applying antibiotic ointment as the blisters open and weep.  I provided you with a prescription for lidocaine ointment that you can apply to the affected areas 3-4 times daily as needed for relief of pain and itching.  If you have not had resolution of the rash within the next week to 10 days, please consider following up with your primary care provider for further evaluation and possible skin testing.  Thank you for visiting [facility] today.     Disposition Upon Discharge:  Condition: stable for discharge home  Patient presented with an acute illness with associated systemic symptoms and significant discomfort requiring urgent management. In my opinion, this is a condition that a prudent lay person (someone who possesses an average knowledge of health and medicine) may potentially expect to result in complications if not addressed urgently such as respiratory distress, impairment of bodily function or dysfunction of bodily organs.   Routine symptom specific, illness specific and/or disease specific instructions were discussed with the patient and/or caregiver at length.   As such, the patient has been evaluated and assessed, work-up was performed and treatment was provided in alignment with urgent care protocols and evidence based medicine.  Patient/parent/caregiver has been advised that the patient may require follow up for further testing and treatment if the symptoms continue in spite of treatment, as clinically indicated and appropriate.  Patient/parent/caregiver has been advised to return to the [facility] or PCP if no better; to PCP or the Emergency Department if new signs and symptoms develop, or if the current signs or symptoms continue to change or worsen for further workup, evaluation and treatment as clinically indicated and appropriate  The patient will follow up with their current PCP if and as advised. If the patient does not currently have a PCP we will assist them in obtaining one.   The patient may need specialty follow up if the symptoms continue, in spite of conservative treatment and management, for further workup, evaluation, consultation and treatment as clinically indicated and appropriate.  Patient/parent/caregiver verbalized understanding and agreement of plan as discussed.  All questions were addressed during  visit.  Please see discharge instructions below for further details of  plan.  This office note has been dictated using Teaching laboratory technician.  Unfortunately, this method of dictation can sometimes lead to typographical or grammatical errors.  I apologize for your inconvenience in advance if this occurs.  Please do not hesitate to reach out to me if clarification is needed.      Theadora Rama Scales, PA-C 08/22/22 1544

## 2022-08-22 NOTE — ED Triage Notes (Signed)
Pt presents to the office for rash all over her body x 3 weeks.

## 2022-08-23 ENCOUNTER — Telehealth (HOSPITAL_COMMUNITY): Payer: Self-pay | Admitting: Emergency Medicine

## 2022-08-23 MED ORDER — LIDOCAINE 5 % EX OINT: 1.0000 | TOPICAL_OINTMENT | Freq: Four times a day (QID) | CUTANEOUS | 0 refills | Status: AC | PRN

## 2023-04-03 ENCOUNTER — Encounter: Payer: No Typology Code available for payment source | Attending: Family Medicine | Admitting: Dietician

## 2023-04-03 ENCOUNTER — Encounter: Payer: Self-pay | Admitting: Dietician

## 2023-04-03 DIAGNOSIS — E669 Obesity, unspecified: Secondary | ICD-10-CM | POA: Insufficient documentation
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
## 2023-07-13 ENCOUNTER — Encounter: Payer: Self-pay | Admitting: Dietician

## 2023-07-13 ENCOUNTER — Encounter: Attending: Family Medicine | Admitting: Dietician

## 2023-07-13 VITALS — Ht 67.0 in | Wt 240.1 lb

## 2023-07-13 DIAGNOSIS — E669 Obesity, unspecified: Secondary | ICD-10-CM | POA: Diagnosis present
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
